# Patient Record
Sex: Female | Born: 1965 | Race: Black or African American | Hispanic: No | Smoking: Never smoker
Health system: Southern US, Community
[De-identification: ages and names within clinical notes are randomized; demographics above are authoritative.]

## PROBLEM LIST (undated history)

## (undated) DIAGNOSIS — J349 Unspecified disorder of nose and nasal sinuses: Secondary | ICD-10-CM

## (undated) DIAGNOSIS — I10 Essential (primary) hypertension: Secondary | ICD-10-CM

## (undated) DIAGNOSIS — B20 Human immunodeficiency virus [HIV] disease: Secondary | ICD-10-CM

## (undated) DIAGNOSIS — D649 Anemia, unspecified: Secondary | ICD-10-CM

## (undated) DIAGNOSIS — A6000 Herpesviral infection of urogenital system, unspecified: Secondary | ICD-10-CM

## (undated) DIAGNOSIS — H5210 Myopia, unspecified eye: Secondary | ICD-10-CM

## (undated) DIAGNOSIS — Z90710 Acquired absence of both cervix and uterus: Secondary | ICD-10-CM

## (undated) DIAGNOSIS — D219 Benign neoplasm of connective and other soft tissue, unspecified: Secondary | ICD-10-CM

## (undated) DIAGNOSIS — E669 Obesity, unspecified: Secondary | ICD-10-CM

## (undated) DIAGNOSIS — L732 Hidradenitis suppurativa: Secondary | ICD-10-CM

## (undated) HISTORY — PX: OTHER SURGICAL HISTORY: SHX169

## (undated) HISTORY — DX: Essential (primary) hypertension: I10

## (undated) HISTORY — DX: Myopia, unspecified eye: H52.10

## (undated) HISTORY — DX: Anemia, unspecified: D64.9

## (undated) HISTORY — DX: Benign neoplasm of connective and other soft tissue, unspecified: D21.9

## (undated) HISTORY — DX: Herpesviral infection of urogenital system, unspecified: A60.00

## (undated) HISTORY — DX: Acquired absence of both cervix and uterus: Z90.710

## (undated) HISTORY — DX: Hidradenitis suppurativa: L73.2

## (undated) HISTORY — DX: Obesity, unspecified: E66.9

## (undated) HISTORY — DX: Unspecified disorder of nose and nasal sinuses: J34.9

## (undated) HISTORY — PX: WISDOM TOOTH EXTRACTION: SHX21

## (undated) HISTORY — PX: ABDOMINAL HYSTERECTOMY: SHX81

## (undated) HISTORY — DX: Human immunodeficiency virus (HIV) disease: B20

---

## 2003-02-11 HISTORY — PX: OTHER SURGICAL HISTORY: SHX169

## 2003-02-11 HISTORY — PX: FLEXIBLE SIGMOIDOSCOPY: SHX1649

## 2006-01-11 ENCOUNTER — Emergency Department (HOSPITAL_COMMUNITY): Admission: EM | Admit: 2006-01-11 | Discharge: 2006-01-11 | Payer: Self-pay | Admitting: Emergency Medicine

## 2006-10-05 ENCOUNTER — Ambulatory Visit: Payer: Self-pay | Admitting: Family Medicine

## 2006-10-05 ENCOUNTER — Other Ambulatory Visit: Admission: RE | Admit: 2006-10-05 | Discharge: 2006-10-05 | Payer: Self-pay | Admitting: Family Medicine

## 2006-10-15 ENCOUNTER — Encounter: Admission: RE | Admit: 2006-10-15 | Discharge: 2006-10-15 | Payer: Self-pay | Admitting: Family Medicine

## 2007-11-18 ENCOUNTER — Ambulatory Visit: Payer: Self-pay | Admitting: Family Medicine

## 2007-11-18 ENCOUNTER — Other Ambulatory Visit: Admission: RE | Admit: 2007-11-18 | Discharge: 2007-11-18 | Payer: Self-pay | Admitting: Family Medicine

## 2007-11-23 ENCOUNTER — Encounter: Admission: RE | Admit: 2007-11-23 | Discharge: 2007-11-23 | Payer: Self-pay | Admitting: Family Medicine

## 2008-02-16 ENCOUNTER — Emergency Department (HOSPITAL_COMMUNITY): Admission: EM | Admit: 2008-02-16 | Discharge: 2008-02-16 | Payer: Self-pay | Admitting: Emergency Medicine

## 2008-02-23 ENCOUNTER — Ambulatory Visit: Payer: Self-pay | Admitting: Family Medicine

## 2008-03-06 ENCOUNTER — Ambulatory Visit: Payer: Self-pay | Admitting: Family Medicine

## 2009-01-29 ENCOUNTER — Encounter: Admission: RE | Admit: 2009-01-29 | Discharge: 2009-01-29 | Payer: Self-pay | Admitting: Family Medicine

## 2009-01-29 LAB — HM MAMMOGRAPHY: HM Mammogram: NORMAL

## 2009-03-27 ENCOUNTER — Ambulatory Visit: Payer: Self-pay | Admitting: Family Medicine

## 2009-03-27 ENCOUNTER — Other Ambulatory Visit: Admission: RE | Admit: 2009-03-27 | Discharge: 2009-03-27 | Payer: Self-pay | Admitting: Family Medicine

## 2009-11-22 ENCOUNTER — Ambulatory Visit: Payer: Self-pay | Admitting: Family Medicine

## 2010-03-05 ENCOUNTER — Ambulatory Visit
Admission: RE | Admit: 2010-03-05 | Discharge: 2010-03-05 | Payer: Self-pay | Source: Home / Self Care | Attending: Family Medicine | Admitting: Family Medicine

## 2010-05-27 LAB — URINE MICROSCOPIC-ADD ON

## 2010-05-27 LAB — URINALYSIS, ROUTINE W REFLEX MICROSCOPIC
Glucose, UA: NEGATIVE mg/dL
Protein, ur: >300 mg/dL — AB
Specific Gravity, Urine: 1.036 — ABNORMAL HIGH (ref 1.005–1.030)
Urobilinogen, UA: 1 mg/dL (ref 0.0–1.0)
pH: 6 (ref 5.0–8.0)

## 2010-05-27 LAB — URINE CULTURE: Colony Count: >=100000

## 2010-05-27 LAB — WET PREP, GENITAL
Trich, Wet Prep: NONE SEEN
Yeast Wet Prep HPF POC: NONE SEEN

## 2010-05-27 LAB — DIFFERENTIAL
Lymphocytes Relative: 36 % (ref 12–46)
Monocytes Relative: 9 % (ref 3–12)
Neutro Abs: 1.1 10*3/uL — ABNORMAL LOW (ref 1.7–7.7)
Neutrophils Relative %: 55 % (ref 43–77)

## 2010-05-27 LAB — POCT I-STAT, CHEM 8
Calcium, Ion: 1.09 mmol/L — ABNORMAL LOW (ref 1.12–1.32)
Chloride: 105 mEq/L (ref 96–112)
Creatinine, Ser: 1 mg/dL (ref 0.4–1.2)
Potassium: 3.3 mEq/L — ABNORMAL LOW (ref 3.5–5.1)
Sodium: 142 mEq/L (ref 135–145)

## 2010-05-27 LAB — CBC
Hemoglobin: 12.6 g/dL (ref 12.0–15.0)
MCV: 71.8 fL — ABNORMAL LOW (ref 78.0–100.0)
Platelets: 101 10*3/uL — ABNORMAL LOW (ref 150–400)
RDW: 16.5 % — ABNORMAL HIGH (ref 11.5–15.5)
WBC: 2 10*3/uL — ABNORMAL LOW (ref 4.0–10.5)

## 2010-08-15 ENCOUNTER — Encounter: Payer: Self-pay | Admitting: Family Medicine

## 2010-08-15 ENCOUNTER — Encounter: Payer: Self-pay | Admitting: *Deleted

## 2010-08-16 ENCOUNTER — Ambulatory Visit: Payer: Self-pay | Admitting: Family Medicine

## 2010-08-23 ENCOUNTER — Ambulatory Visit (INDEPENDENT_AMBULATORY_CARE_PROVIDER_SITE_OTHER): Payer: PRIVATE HEALTH INSURANCE | Admitting: Family Medicine

## 2010-08-23 ENCOUNTER — Encounter: Payer: Self-pay | Admitting: Family Medicine

## 2010-08-23 VITALS — BP 150/100 | HR 60 | Wt 221.0 lb

## 2010-08-23 DIAGNOSIS — B9689 Other specified bacterial agents as the cause of diseases classified elsewhere: Secondary | ICD-10-CM

## 2010-08-23 DIAGNOSIS — L739 Follicular disorder, unspecified: Secondary | ICD-10-CM

## 2010-08-23 DIAGNOSIS — L738 Other specified follicular disorders: Secondary | ICD-10-CM

## 2010-08-23 DIAGNOSIS — A499 Bacterial infection, unspecified: Secondary | ICD-10-CM

## 2010-08-23 DIAGNOSIS — N760 Acute vaginitis: Secondary | ICD-10-CM

## 2010-08-23 DIAGNOSIS — J329 Chronic sinusitis, unspecified: Secondary | ICD-10-CM

## 2010-08-23 MED ORDER — AMOXICILLIN 875 MG PO TABS: 875.0000 mg | ORAL_TABLET | Freq: Two times a day (BID) | ORAL | Status: AC

## 2010-08-23 MED ORDER — METRONIDAZOLE 500 MG PO TABS: 500.0000 mg | ORAL_TABLET | Freq: Two times a day (BID) | ORAL | Status: AC

## 2010-08-23 NOTE — Progress Notes (Signed)
  Subjective:    Patient ID: Teresa Doyle, female    DOB: 12-26-1965, 45 y.o.   MRN: [id number]  HPI She has a several month history of PND and recent onset of sinus pressure. She will occasionally cough up some purulent material as well as some difficulty with hoarse voice. No fever, chills, sore throat. She does not smoke. Has no allergies. 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 Tympanic membranes and canals are normal. Throat is clear. Tonsils are normal. Neck is supple without adenopathy or thyromegaly. Cardiac exam shows a regular sinus rhythm without murmurs or gallops. Lungs are clear to auscultation. Nasal mucosa is slightly swollen with some tenderness over sinuses       Assessment & Plan:  Folliculitis. Sinusitis. History of BV I will give her amoxicillin for the sinuses and a standing prescription for Flagyl. Commend OTC meds for the folliculitis

## 2010-08-23 NOTE — Patient Instructions (Signed)
Use heat to the area 3 or 4 times per day. Use cortisone cream if it itches a lot

## 2010-09-13 ENCOUNTER — Ambulatory Visit: Payer: PRIVATE HEALTH INSURANCE | Admitting: Family Medicine

## 2010-09-20 ENCOUNTER — Telehealth: Payer: Self-pay | Admitting: *Deleted

## 2010-09-20 ENCOUNTER — Other Ambulatory Visit: Payer: Self-pay | Admitting: Medical

## 2010-09-20 MED ORDER — FLUCONAZOLE 150 MG PO TABS: 150.0000 mg | ORAL_TABLET | Freq: Once | ORAL | Status: DC

## 2010-09-20 NOTE — Telephone Encounter (Signed)
Pt informed prescription was sent to pharmacy.  CM, LPN

## 2010-09-20 NOTE — Telephone Encounter (Signed)
i sent Diflucan x 1 tablet today to pharmacy.   She was seen almost a month ago, which seems a little far out to get yeast infection just now.  If not improving, recheck on the yeast infection.

## 2010-10-25 ENCOUNTER — Other Ambulatory Visit: Payer: Self-pay | Admitting: Family Medicine

## 2010-10-25 DIAGNOSIS — Z1231 Encounter for screening mammogram for malignant neoplasm of breast: Secondary | ICD-10-CM

## 2010-11-15 ENCOUNTER — Encounter: Payer: Self-pay | Admitting: Family Medicine

## 2010-11-15 ENCOUNTER — Ambulatory Visit
Admission: RE | Admit: 2010-11-15 | Discharge: 2010-11-15 | Disposition: A | Discharge status: Home / Self Care | Payer: PRIVATE HEALTH INSURANCE | Source: Ambulatory Visit | Attending: Family Medicine | Admitting: Family Medicine

## 2010-11-15 ENCOUNTER — Ambulatory Visit (INDEPENDENT_AMBULATORY_CARE_PROVIDER_SITE_OTHER): Payer: PRIVATE HEALTH INSURANCE | Admitting: Family Medicine

## 2010-11-15 VITALS — BP 118/76 | HR 64 | Temp 98.2°F | Ht 62.0 in | Wt 219.0 lb

## 2010-11-15 DIAGNOSIS — Z23 Encounter for immunization: Secondary | ICD-10-CM

## 2010-11-15 DIAGNOSIS — Z1231 Encounter for screening mammogram for malignant neoplasm of breast: Secondary | ICD-10-CM

## 2010-11-15 DIAGNOSIS — J209 Acute bronchitis, unspecified: Secondary | ICD-10-CM

## 2010-11-15 DIAGNOSIS — R05 Cough: Secondary | ICD-10-CM

## 2010-11-15 MED ORDER — AMOXICILLIN 875 MG PO TABS: 875.0000 mg | ORAL_TABLET | Freq: Two times a day (BID) | ORAL | Status: AC

## 2010-11-15 NOTE — Progress Notes (Signed)
  Subjective:    Patient ID: Teresa Doyle, female    DOB: 1965/11/29, 45 y.o.   MRN: [id number]  HPI She has a two-year history of intermittent hoarse voice, PND and occasional productive cough. No sneezing, itchy watery eyes or runny nose. She does not smoke. She says that she has monthly issues with this but twice per year it gets more intense. She notes that certain foods do tend to make this worse as well as talking that she has to do well at work area and lying down at night sometimes causes difficulty   Review of Systems     Objective:   Physical Exam alert and in no distress. Tympanic membranes and canals are normal. Throat is clear. Tonsils are normal. Neck is supple without adenopathy or thyromegaly. Cardiac exam shows a regular sinus rhythm without murmurs or gallops. Lungs are clear to auscultation.        Assessment & Plan:  Bronchitis I will give her Amoxil. Also recommend she take Prilosec for the next several weeks and call me and let me know how it is helping her coughing

## 2010-11-15 NOTE — Patient Instructions (Signed)
Take all the antibiotic. Use of Prilosec daily for the next several weeks and then call me

## 2010-11-18 ENCOUNTER — Telehealth: Payer: Self-pay

## 2010-11-18 NOTE — Telephone Encounter (Signed)
Pt called and asked if you could call in diflucan for her yeast inf [facility] [address].

## 2010-11-19 ENCOUNTER — Other Ambulatory Visit: Payer: Self-pay

## 2010-11-19 MED ORDER — FLUCONAZOLE 150 MG PO TABS: 150.0000 mg | ORAL_TABLET | Freq: Once | ORAL | Status: DC

## 2010-11-19 NOTE — Telephone Encounter (Signed)
Dr.lalonde I sent you a message on this pt yesterday not sure if you got it she would like diflucan called in the antibiotic has caused a yeast inf.

## 2010-11-19 NOTE — Telephone Encounter (Signed)
SENT MED IN PER JCL 

## 2010-11-19 NOTE — Telephone Encounter (Signed)
Go ahead and call in Diflucan 150 mg tablet

## 2010-11-28 ENCOUNTER — Ambulatory Visit (INDEPENDENT_AMBULATORY_CARE_PROVIDER_SITE_OTHER): Payer: PRIVATE HEALTH INSURANCE | Admitting: Family Medicine

## 2010-11-28 ENCOUNTER — Encounter: Payer: Self-pay | Admitting: Family Medicine

## 2010-11-28 VITALS — BP 124/74 | HR 64 | Wt 218.0 lb

## 2010-11-28 DIAGNOSIS — B009 Herpesviral infection, unspecified: Secondary | ICD-10-CM

## 2010-11-28 MED ORDER — VALACYCLOVIR HCL 1 G PO TABS: 1000.0000 mg | ORAL_TABLET | Freq: Two times a day (BID) | ORAL | Status: DC

## 2010-11-28 NOTE — Progress Notes (Signed)
Had a yeast infection from recent antibiotics.  Took Diflucan which helped (took 2 days after starting the ABX, and repeated after the ABX)--no longer having vaginal discharge or internal discomfort.  Complaining of painful external lesions in vaginal area--feels like abrasions, painful when urine touches it, and even for clothes to touch it.  Slightly itchy.  Presents today for evaluation of these external lesions.  Denies h/o STD, denies vaginal discharge, denies previous similar lesions.  Past Medical History  Diagnosis Date  . Obesity   . Hypertension   . Fibroids uterine    Past Surgical History  Procedure Date  . Myomectomy 2005  . Bilateral tubal ligation     History   Social History  . Marital Status: Single    Spouse Name: N/A    Number of Children: 2  . Years of Education: N/A   Occupational History  . LPN    Social History Main Topics  . Smoking status: Never Smoker   . Smokeless tobacco: Never Used  . Alcohol Use: Yes     rare  . Drug Use: No  . Sexually Active: Not on file   Other Topics Concern  . Not on file   Social History Narrative   Lives with her 2 children, dog   Family History  Problem Relation Age of Onset  . Diabetes Neg Hx    Current Outpatient Prescriptions on File Prior to Visit  Medication Sig Dispense Refill  . bisoprolol-hydrochlorothiazide (ZIAC) 10-6.25 MG per tablet Take 1 tablet by mouth daily.         No Known Allergies  ROS:  Denies fevers, myalgias, vaginal discharge, abdominal pain, headaches, cough, URI symptoms, chest pain, or other concerns  PHYSICAL EXAM: BP 124/74  Pulse 64  Wt 218 lb (98.884 kg) Pleasant female in no distress  External genitalia:  Superior/anterior to clitoris was a slightly thickened area.  L labia had an excoriation, also a small pustule/cyst that was nontender.  R labia had 2 ulcerated lesions, with some mild induration  ASSESSMENT/PLAN: 1. HSV infection  valACYclovir (VALTREX) 1000 MG tablet,  HSV(herpes simplex vrs) 1+2 ab-IgG, HSV(herpes simplex vrs) 1+2 ab-IgM   Suspect genital herpes, and history suggests this is first outbreak, therefore treating with higher dose/longer course.  Lesions too old to get an accurate culture.  Will therefore check blood test and treat presumptively.  Discussed diagnosis of herpes, how it is spread, etc and all questions answered.

## 2010-11-28 NOTE — Patient Instructions (Signed)
We are treating presumptively for possible herpes infection.  We will contact you with blood test results next week. You may use hydrocortisone cream twice daily to help with itching, if needed.  If it is herpes, and you have recurrences, the dosing of Valtrex will be different, so call for new Rx, and not just a refill.

## 2010-11-29 LAB — HSV(HERPES SIMPLEX VRS) I + II AB-IGG: HSV 2 Glycoprotein G Ab, IgG: 7.09 IV — ABNORMAL HIGH

## 2010-11-29 LAB — HSV(HERPES SIMPLEX VRS) I + II AB-IGM: Herpes Simplex Vrs I&II-IgM Ab (EIA): 1.06 INDEX

## 2010-12-09 ENCOUNTER — Other Ambulatory Visit: Payer: Self-pay | Admitting: Obstetrics and Gynecology

## 2010-12-09 ENCOUNTER — Other Ambulatory Visit (HOSPITAL_COMMUNITY)
Admission: RE | Admit: 2010-12-09 | Discharge: 2010-12-09 | Disposition: A | Discharge status: Home / Self Care | Payer: PRIVATE HEALTH INSURANCE | Source: Ambulatory Visit | Attending: Obstetrics and Gynecology | Admitting: Obstetrics and Gynecology

## 2010-12-09 DIAGNOSIS — Z124 Encounter for screening for malignant neoplasm of cervix: Secondary | ICD-10-CM | POA: Insufficient documentation

## 2010-12-09 DIAGNOSIS — R87810 Cervical high risk human papillomavirus (HPV) DNA test positive: Secondary | ICD-10-CM | POA: Insufficient documentation

## 2010-12-20 ENCOUNTER — Ambulatory Visit: Payer: PRIVATE HEALTH INSURANCE | Admitting: Medical

## 2010-12-27 ENCOUNTER — Telehealth: Payer: Self-pay | Admitting: Family Medicine

## 2010-12-28 ENCOUNTER — Other Ambulatory Visit: Payer: Self-pay | Admitting: Family Medicine

## 2010-12-28 MED ORDER — BISOPROLOL-HYDROCHLOROTHIAZIDE 10-6.25 MG PO TABS: 1.0000 | ORAL_TABLET | Freq: Every day | ORAL | Status: DC

## 2010-12-30 ENCOUNTER — Other Ambulatory Visit: Payer: Self-pay | Admitting: *Deleted

## 2010-12-30 MED ORDER — BISOPROLOL-HYDROCHLOROTHIAZIDE 10-6.25 MG PO TABS: 1.0000 | ORAL_TABLET | Freq: Every day | ORAL | Status: DC

## 2010-12-30 NOTE — Telephone Encounter (Signed)
Patient wanted 90 day supply called to [facility] [address]. Called in Ziac 10/6.25 #90 once daily with 3 refills.

## 2011-01-07 NOTE — Telephone Encounter (Signed)
DONE

## 2011-01-08 ENCOUNTER — Other Ambulatory Visit: Payer: Self-pay | Admitting: Obstetrics and Gynecology

## 2011-01-31 ENCOUNTER — Encounter: Payer: Self-pay | Admitting: Medical

## 2011-01-31 ENCOUNTER — Ambulatory Visit (INDEPENDENT_AMBULATORY_CARE_PROVIDER_SITE_OTHER): Payer: PRIVATE HEALTH INSURANCE | Admitting: Medical

## 2011-01-31 VITALS — BP 112/80 | HR 62 | Temp 98.4°F | Resp 16 | Wt 216.0 lb

## 2011-01-31 DIAGNOSIS — L732 Hidradenitis suppurativa: Secondary | ICD-10-CM | POA: Insufficient documentation

## 2011-01-31 MED ORDER — DOXYCYCLINE HYCLATE 100 MG PO TABS: 100.0000 mg | ORAL_TABLET | Freq: Two times a day (BID) | ORAL | Status: AC

## 2011-01-31 NOTE — Progress Notes (Signed)
Subjective:   HPI  Teresa Doyle is a 45 y.o. female who presents with c/o boil under right arm.  She notes similar prior a year ago that had to be lanced.  No other recurrent boils.  The last boil was MRSA+.  Denies fever, chills, and no other aggravating or relieving factors.    No other c/o.  The following portions of the patient's history were reviewed and updated as appropriate: allergies, current medications, past family history, past medical history, past social history, past surgical history and problem list.  Past Medical History  Diagnosis Date  . Obesity   . Hypertension   . Fibroids uterine   Review of Systems Constitutional: -fever, -chills, -sweats, -unexpected -weight change,-fatigue ENT: -runny nose, -ear pain, -sore throat Cardiology:  -chest pain, -palpitations, -edema Respiratory: -cough, -shortness of breath, -wheezing Gastroenterology: -abdominal pain, -nausea, -vomiting, -diarrhea, -constipation Hematology: -bleeding or bruising problems Musculoskeletal: -arthralgias, -myalgias, -joint swelling, -back pain Ophthalmology: -vision changes Urology: -dysuria, -difficulty urinating, -hematuria, -urinary frequency, -urgency Neurology: -headache, -weakness, -tingling, -numbness    Objective:   Physical Exam  Filed Vitals:   01/31/11 1128  BP: 112/80  Pulse: 62  Temp: 98.4 F (36.9 C)  Resp: 16    General appearance: alert, no distress, WD/WN Skin: right axilla with 4cm area of induration, mild erythema, but no warmth or fluctuance   Assessment and Plan :    Encounter Diagnosis  Name Primary?  . Suppurative hidradenitis Yes   Script for Doxycyline, c/t warm compresses, discussed hygiene, and once this heals, begin with new deodorant and razor to prevent contamination.  Discussed potential worsening symptoms that would prompt recheck.   Call/return if worse or not improving.  follow-up otherwise prn.

## 2011-03-04 ENCOUNTER — Other Ambulatory Visit: Payer: Self-pay | Admitting: Medical

## 2011-03-04 ENCOUNTER — Telehealth: Payer: Self-pay | Admitting: Internal Medicine

## 2011-03-04 MED ORDER — DOXYCYCLINE HYCLATE 100 MG PO TABS: 100.0000 mg | ORAL_TABLET | Freq: Two times a day (BID) | ORAL | Status: AC

## 2011-03-04 NOTE — Telephone Encounter (Signed)
rx sent

## 2011-03-07 ENCOUNTER — Ambulatory Visit (INDEPENDENT_AMBULATORY_CARE_PROVIDER_SITE_OTHER): Payer: PRIVATE HEALTH INSURANCE | Admitting: Medical

## 2011-03-07 ENCOUNTER — Other Ambulatory Visit: Payer: Self-pay | Admitting: Medical

## 2011-03-07 ENCOUNTER — Encounter: Payer: Self-pay | Admitting: Medical

## 2011-03-07 VITALS — BP 122/80 | HR 72 | Temp 98.3°F | Resp 16 | Wt 219.0 lb

## 2011-03-07 DIAGNOSIS — L02411 Cutaneous abscess of right axilla: Secondary | ICD-10-CM

## 2011-03-07 DIAGNOSIS — L732 Hidradenitis suppurativa: Secondary | ICD-10-CM

## 2011-03-07 DIAGNOSIS — IMO0002 Reserved for concepts with insufficient information to code with codable children: Secondary | ICD-10-CM

## 2011-03-07 MED ORDER — HYDROCODONE-ACETAMINOPHEN 5-500 MG PO CAPS: 1.0000 | ORAL_CAPSULE | Freq: Four times a day (QID) | ORAL | Status: AC | PRN

## 2011-03-07 NOTE — Progress Notes (Signed)
Subjective:  Teresa Doyle is a 46 y.o. female who presents for evaluation of a probable cutaneous abscess. Lesion is located in the right axilla. Onset was 3 days ago. Symptoms have gradually worsened.  Abscess has associated symptoms of pain. Patient does have previous history of cutaneous abscesses - 3 prior in axilla, hx/o MRSA. Patient does not have diabetes.   Objective:   There is an area characterized by 5cm indurated lesion with 2-3 cm central fluctuant area, surrounding erythema, tender,right axilla.  Procedure Informed consent obtained.  The area was prepped in the usual manner and the skin overlying the abscess was anesthetized with 4.5 cc of 2% plain lidocaine.  The area was sharply incised with #11 blade, and approx 4 ccs of purulent material was obtained.  Area was irrigated with high pressure saline. Packing was inserted. Wound was covered with sterile bandage.     Assessment:   Encounter Diagnoses  Name Primary?  . Suppurative hidradenitis Yes  . Abscess of axilla, right      Plan:   Discussed diagnosis, usual course of treatment, advised she continue to apply hot compresses frequently to promote drainage, continue Doxycyline that was called out a few days ago.  She will pull the packing out on Sunday in 2 days. Call report Monday.  PA student Haig Prophet assisted in procedure.

## 2011-03-07 NOTE — Patient Instructions (Signed)

## 2011-03-11 LAB — WOUND CULTURE: Gram Stain: NONE SEEN

## 2011-03-14 ENCOUNTER — Telehealth: Payer: Self-pay | Admitting: Family Medicine

## 2011-03-17 NOTE — Telephone Encounter (Signed)
DONE

## 2011-03-25 ENCOUNTER — Telehealth: Payer: Self-pay | Admitting: Internal Medicine

## 2011-03-25 NOTE — Telephone Encounter (Signed)
pt called and said that she just got done with her cycle of doxcycline and now had a yeast infection. she said she is having discharge,itching and a tad bit of odor. she wanted to know if you could call a 3 day dose [facility] [address]. She also has a history of BV and shes not sure if that is what she may have too.

## 2011-03-26 ENCOUNTER — Other Ambulatory Visit: Payer: Self-pay | Admitting: Medical

## 2011-03-26 MED ORDER — VALACYCLOVIR HCL 1 G PO TABS: 1000.0000 mg | ORAL_TABLET | Freq: Two times a day (BID) | ORAL | Status: DC

## 2011-03-26 MED ORDER — FLUCONAZOLE 100 MG PO TABS: ORAL_TABLET | ORAL | Status: DC

## 2011-03-26 NOTE — Telephone Encounter (Signed)
i sent diflucan to pharmacy.  I would have to check swabs to verify BV.  She can come in for eval if desired, otherwise, can begin round of diflucan

## 2011-03-26 NOTE — Telephone Encounter (Signed)
I TOLD THE PATIENT THAT Teresa Doyle FOUND THE LABS AND THAT HE WOULD SEND THE RX TO THE PHARMACY SHE WILL NEED TO CHECK WITH THE PHARMACY LATER. CLS

## 2011-03-26 NOTE — Telephone Encounter (Signed)
PATIENT WANTS TO KNOW CAN SHE GET A REFILL ON VALACYCLOVIR. SHE SAID SHE WAS DX WITH BEING EXPOSED TO HERPES YEARS AGO AND WAS TOLD TO TREAT WHEN HAVING SX. SHE SAID SHE HAS NOT HAD A OUTBREAK BUT SHE HAS A FEW SPOTS NOW. CLS   PATIENT WAS NOTIFIED THAT DIFLUCIAN WAS SENT TO THE PHARMACY. CLS

## 2011-03-26 NOTE — Telephone Encounter (Signed)
So does this mean she herself has been diagnosed prior or not?  This sounds like she should come in for eval today/exam for this?

## 2011-05-07 ENCOUNTER — Ambulatory Visit (INDEPENDENT_AMBULATORY_CARE_PROVIDER_SITE_OTHER): Payer: PRIVATE HEALTH INSURANCE | Admitting: Medical

## 2011-05-07 ENCOUNTER — Encounter: Payer: Self-pay | Admitting: Medical

## 2011-05-07 VITALS — BP 120/80 | HR 62 | Temp 98.7°F | Resp 16 | Wt 210.0 lb

## 2011-05-07 DIAGNOSIS — L732 Hidradenitis suppurativa: Secondary | ICD-10-CM

## 2011-05-07 DIAGNOSIS — Q839 Congenital malformation of breast, unspecified: Secondary | ICD-10-CM

## 2011-05-07 DIAGNOSIS — R21 Rash and other nonspecific skin eruption: Secondary | ICD-10-CM

## 2011-05-07 DIAGNOSIS — Q838 Other congenital malformations of breast: Secondary | ICD-10-CM

## 2011-05-07 MED ORDER — MUPIROCIN 2 % EX OINT: TOPICAL_OINTMENT | Freq: Three times a day (TID) | CUTANEOUS | Status: AC

## 2011-05-07 MED ORDER — DOXYCYCLINE HYCLATE 100 MG PO TABS: 100.0000 mg | ORAL_TABLET | Freq: Two times a day (BID) | ORAL | Status: AC

## 2011-05-07 NOTE — Progress Notes (Signed)
Subjective:   HPI  Teresa Doyle is a 46 y.o. female who presents with five-day history of left axillary boil like she's had before. This is her third such lesion, but usually they're on the right underarm.  She has a positive history of suppurative hidradenitis, MRSA positive.  In general she has been using Hibiclens wash daily, using fresh razors for underarm shaving.  She denies fever, chills, nausea, vomiting.  She has a second concern of rash on her breast. She notes one-year history of rash around both areolas, has discussed this with Dr. Susann Givens here prior, but the rash never went away.  She wants to make sure something worrisome.  He has used some hydrocortisone cream in the past and it helped a little with itching, but nothing has made the rash go away completely. No other aggravating or relieving factors.  She sees gynecology, up-to-date on mammogram which has been normal  No other c/o.  The following portions of the patient's history were reviewed and updated as appropriate: allergies, current medications, past family history, past medical history, past social history, past surgical history and problem list.  Past Medical History  Diagnosis Date  . Obesity   . Hypertension   . Fibroids uterine  . Suppurative hidradenitis     w/ prior multiple abscess, MRSA +    No Known Allergies   Review of Systems ROS reviewed and was negative other than noted in HPI or above.    Objective:   Physical Exam  General appearance: alert, no distress, WD/WN Left axilla with tender 2 cm indurated and somewhat warm lesion, but no fluctuance or drainage. Bilat areoli with slightly rough skin circumferential around the areola, and there are a few slightly raised small 1 mm papules within the rough area of the areoli circumferentially of both breasts, and the right breat has an area of brown flat skin asymmetrically at 10 o'clock location of right areola   Assessment and Plan :     Encounter  Diagnoses  Name Primary?  . Suppurative hidradenitis Yes  . Rash, skin   . Breast anomaly    Suppurative hidradenitis with cellulitis-prescription for doxycycline, warm compresses, continue Hibiclens routinely, good hygiene, and call or return if worse or not improving  Rash of bilateral areoli - refer to dermatology for further eval

## 2011-06-04 ENCOUNTER — Ambulatory Visit: Payer: PRIVATE HEALTH INSURANCE | Admitting: Medical

## 2011-06-05 ENCOUNTER — Ambulatory Visit (INDEPENDENT_AMBULATORY_CARE_PROVIDER_SITE_OTHER): Payer: PRIVATE HEALTH INSURANCE | Admitting: Family Medicine

## 2011-06-05 ENCOUNTER — Encounter: Payer: Self-pay | Admitting: Family Medicine

## 2011-06-05 VITALS — BP 130/80 | HR 76 | Wt 213.0 lb

## 2011-06-05 DIAGNOSIS — IMO0001 Reserved for inherently not codable concepts without codable children: Secondary | ICD-10-CM

## 2011-06-05 DIAGNOSIS — S53449A Ulnar collateral ligament sprain of unspecified elbow, initial encounter: Secondary | ICD-10-CM

## 2011-06-05 NOTE — Patient Instructions (Signed)
Leave alone for now but if you are still having difficulty in 2 weeks, call for referral to hand specialist
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
## 2011-06-10 ENCOUNTER — Ambulatory Visit (INDEPENDENT_AMBULATORY_CARE_PROVIDER_SITE_OTHER): Payer: PRIVATE HEALTH INSURANCE | Admitting: Medical

## 2011-06-10 ENCOUNTER — Encounter: Payer: Self-pay | Admitting: Medical

## 2011-06-10 VITALS — BP 120/80 | HR 60 | Temp 98.2°F | Resp 16 | Ht 62.0 in | Wt 214.0 lb

## 2011-06-10 DIAGNOSIS — Z0000 Encounter for general adult medical examination without abnormal findings: Secondary | ICD-10-CM

## 2011-06-10 DIAGNOSIS — D649 Anemia, unspecified: Secondary | ICD-10-CM

## 2011-06-10 DIAGNOSIS — I10 Essential (primary) hypertension: Secondary | ICD-10-CM

## 2011-06-10 DIAGNOSIS — E049 Nontoxic goiter, unspecified: Secondary | ICD-10-CM

## 2011-06-10 DIAGNOSIS — R32 Unspecified urinary incontinence: Secondary | ICD-10-CM | POA: Insufficient documentation

## 2011-06-10 DIAGNOSIS — E669 Obesity, unspecified: Secondary | ICD-10-CM

## 2011-06-10 NOTE — Patient Instructions (Signed)

## 2011-06-10 NOTE — Progress Notes (Signed)
Subjective:   HPI  Teresa Doyle is a 46 y.o. female who presents for a complete physical.  She is fasting today.   Sees gynecology, [facility] on [address], last mammogram 16/10, last pap 10/12, had abnormal pap and is going back soon for repeat pap.  Had colposcopy.  Last opthalmology 10/12.  Last dental visit - goes tomorrow.  Last tdap at our facility?    Hx/o iron deficiency anemia.  Last iron orally was 15 years ago.   Hx/o goiter, ultrasound years ago, no other workup.   Reviewed their medical, surgical, family, social, medication, and allergy history and updated chart as appropriate.   Past Medical History  Diagnosis Date  . Obesity   . Hypertension   . Fibroids uterine  . Suppurative hidradenitis     w/ prior multiple abscess, MRSA +  . Sinus problem     twice yearly   . Anemia     iron deficient, since teenage years  . Nearsightedness     wears glasses  . Genital HSV   . Urinary incontinence   . Normal labor and delivery     5/92 and 8/95    Past Surgical History  Procedure Date  . Myomectomy 2005  . Bilateral tubal ligation   . Wisdom tooth extraction   . Flexible sigmoidoscopy 2005    due to abdominal pain; normal     Family History  Problem Relation Age of Onset  . Diabetes Neg Hx   . Stroke Neg Hx   . Cancer Neg Hx   . Heart disease Neg Hx   . Hypertension Neg Hx   . Hyperlipidemia Mother     History   Social History  . Marital Status: Single    Spouse Name: N/A    Number of Children: 2  . 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 Smoking status: Never Smoker   . Smokeless tobacco: Never Used  . Alcohol Use: No     rare  . Drug Use: No  . Sexually Active: Not on file   Other Topics Concern  . Not on file   Social History Narrative   Lives with her 2 children (1 daughter, 1 son), dog; single, exercise - walking, sometimes treadmill with  fitness center at work    Current Outpatient Prescriptions on File Prior to Visit  Medication Sig Dispense Refill  . bisoprolol-hydrochlorothiazide (ZIAC) 10-6.25 MG per tablet Take 1 tablet by mouth daily.  90 tablet  3  . valACYclovir (VALTREX) 1000 MG tablet Take 1 tablet (1,000 mg total) by mouth 2 (two) times daily.  20 tablet  1    No Known Allergies  Review of Systems Constitutional: -fever, -chills, -sweats, -unexpected weight change, -anorexia, -fatigue Allergy: -sneezing, -itching, -congestion Dermatology: denies changing moles, rash, lumps, new worrisome lesions ENT: -runny nose, -ear pain, -sore throat, -hoarseness, -sinus pain, -teeth pain, -tinnitus, -hearing loss, -epistaxis Cardiology:  -chest pain, -palpitations, -edema, -orthopnea, -paroxysmal nocturnal dyspnea Respiratory: -cough, -shortness of breath, -dyspnea on exertion, -wheezing, -hemoptysis Gastroenterology: -abdominal pain, -nausea, -vomiting, -diarrhea, -constipation, -blood in stool, -changes in bowel movement, -dysphagia Hematology: -bleeding or bruising problems Musculoskeletal: -arthralgias, -myalgias, -joint swelling, -back pain, -neck pain, -cramping, -gait changes Ophthalmology: -vision changes, -eye redness, -itching, -discharge Urology: -dysuria, -difficulty urinating, -hematuria, -urinary frequency, -urgency, incontinence Neurology: -headache, -weakness, -tingling, -numbness, -speech abnormality, -memory loss, -falls, -  dizziness Psychology:  -depressed mood, -agitation, -sleep problems      Objective:   Physical Exam  Filed Vitals:   06/10/11 0930  BP: 120/80  Pulse: 60  Temp: 98.2 F (36.8 C)  Resp: 16    General appearance: alert, no distress, WD/WN, obese female  Skin: few scattered benign appearing macules HEENT: normocephalic, conjunctiva/corneas normal, sclerae anicteric, PERRLA, EOMi, nares patent, no discharge or erythema, pharynx normal Oral cavity: MMM, tongue normal, teeth in  good repair Neck: supple, no lymphadenopathy, no thyromegaly, no masses, normal ROM, no bruits Chest: non tender, normal shape and expansion Heart: RRR, normal S1, S2, no murmurs Lungs: CTA bilaterally, no wheezes, rhonchi, or rales Abdomen: +bs, soft, non tender, non distended, no masses, no hepatomegaly, no splenomegaly, no bruits Back: non tender, normal ROM, no scoliosis Musculoskeletal: upper extremities non tender, no obvious deformity, normal ROM throughout, lower extremities non tender, no obvious deformity, normal ROM throughout Extremities: no edema, no cyanosis, no clubbing Pulses: 2+ symmetric, upper and lower extremities, normal cap refill Neurological: alert, oriented x 3, CN2-12 intact, strength normal upper extremities and lower extremities, sensation normal throughout, DTRs 2+ throughout, no cerebellar signs, gait normal Psychiatric: normal affect, behavior normal, pleasant  Breast/gyn/rectal - deferred to gyn   Assessment and Plan :    Encounter Diagnoses  Name Primary?  . Routine general medical examination at a health care facility Yes  . Anemia   . Obesity   . Essential hypertension, benign   . Goiter   . Urinary incontinence     Physical exam - discussed healthy lifestyle, diet, exercise, preventative care, vaccinations, and addressed their concerns.  Handout given.  Labs today, may need further eval on anemia and goiter pending labs.   Sent home with stool cards for hemoccult testing.   Obesity - discussed need for lifestyle, exercise and diet changes.   She will f/u next month with gynecology and will discuss her incontinence and possible therapy options.  Follow-up pending labs.

## 2011-06-11 LAB — RETICULOCYTES
ABS Retic: 55.8 10*3/uL (ref 19.0–186.0)
RBC.: 4.65 MIL/uL (ref 3.87–5.11)
Retic Ct Pct: 1.2 % (ref 0.4–2.3)

## 2011-06-11 LAB — LIPID PANEL
Cholesterol: 158 mg/dL (ref 0–200)
HDL: 67 mg/dL (ref >39)
LDL Cholesterol: 74 mg/dL (ref 0–99)
Triglycerides: 85 mg/dL (ref <150)

## 2011-06-11 LAB — COMPREHENSIVE METABOLIC PANEL
AST: 22 U/L (ref 0–37)
Albumin: 3.5 g/dL (ref 3.5–5.2)
BUN: 12 mg/dL (ref 6–23)
Calcium: 8.7 mg/dL (ref 8.4–10.5)
Creat: 0.54 mg/dL (ref 0.50–1.10)
Total Bilirubin: 0.3 mg/dL (ref 0.3–1.2)
Total Protein: 6.7 g/dL (ref 6.0–8.3)

## 2011-06-11 LAB — IBC PANEL
%SAT: 14 % — ABNORMAL LOW (ref 20–55)
TIBC: 317 ug/dL (ref 250–470)

## 2011-06-11 LAB — CBC WITH DIFFERENTIAL/PLATELET
Eosinophils Absolute: 0.1 10*3/uL (ref 0.0–0.7)
Lymphs Abs: 1 10*3/uL (ref 0.7–4.0)
MCH: 21.9 pg — ABNORMAL LOW (ref 26.0–34.0)
Neutro Abs: 2.1 10*3/uL (ref 1.7–7.7)
Neutrophils Relative %: 57 % (ref 43–77)
Platelets: 171 10*3/uL (ref 150–400)
RBC: 4.65 MIL/uL (ref 3.87–5.11)
WBC: 3.6 10*3/uL — ABNORMAL LOW (ref 4.0–10.5)

## 2011-06-11 LAB — T4, FREE: Free T4: 1.18 ng/dL (ref 0.80–1.80)

## 2011-06-11 LAB — TSH: TSH: 0.476 u[IU]/mL (ref 0.350–4.500)

## 2011-06-13 ENCOUNTER — Other Ambulatory Visit: Payer: Self-pay | Admitting: Medical

## 2011-06-13 MED ORDER — FERROUS FUM-IRON POLYSACCH 162-115.2 MG PO CAPS: 1.0000 | ORAL_CAPSULE | Freq: Every day | ORAL | Status: DC

## 2011-06-13 NOTE — Progress Notes (Signed)
Addended by: Janeice Robinson on: 06/13/2011 10:39 AM   Modules accepted: Orders

## 2011-06-18 ENCOUNTER — Other Ambulatory Visit: Payer: PRIVATE HEALTH INSURANCE

## 2011-06-25 ENCOUNTER — Other Ambulatory Visit (INDEPENDENT_AMBULATORY_CARE_PROVIDER_SITE_OTHER): Payer: PRIVATE HEALTH INSURANCE

## 2011-06-25 DIAGNOSIS — Z1211 Encounter for screening for malignant neoplasm of colon: Secondary | ICD-10-CM

## 2011-06-25 LAB — HEMOCCULT GUIAC POC 1CARD (OFFICE)
Card #3 Fecal Occult Blood, POC: NEGATIVE
Fecal Occult Blood, POC: NEGATIVE

## 2011-10-22 ENCOUNTER — Other Ambulatory Visit: Payer: Self-pay | Admitting: Family Medicine

## 2011-10-22 DIAGNOSIS — Z1231 Encounter for screening mammogram for malignant neoplasm of breast: Secondary | ICD-10-CM

## 2011-11-11 DIAGNOSIS — B20 Human immunodeficiency virus [HIV] disease: Secondary | ICD-10-CM

## 2011-11-11 HISTORY — DX: Human immunodeficiency virus (HIV) disease: B20

## 2011-11-14 ENCOUNTER — Ambulatory Visit (INDEPENDENT_AMBULATORY_CARE_PROVIDER_SITE_OTHER): Payer: Self-pay | Admitting: Family Medicine

## 2011-11-14 VITALS — BP 120/70 | HR 77 | Temp 99.2°F

## 2011-11-14 DIAGNOSIS — R05 Cough: Secondary | ICD-10-CM

## 2011-11-14 DIAGNOSIS — R0989 Other specified symptoms and signs involving the circulatory and respiratory systems: Secondary | ICD-10-CM

## 2011-11-14 LAB — CBC WITH DIFFERENTIAL/PLATELET
Eosinophils Relative: 1 % (ref 0–5)
HCT: 30.6 % — ABNORMAL LOW (ref 36.0–46.0)
Hemoglobin: 10.1 g/dL — ABNORMAL LOW (ref 12.0–15.0)
Lymphocytes Relative: 15 % (ref 12–46)
Lymphs Abs: 0.9 10*3/uL (ref 0.7–4.0)
MCV: 66.4 fL — ABNORMAL LOW (ref 78.0–100.0)
Monocytes Absolute: 0.9 10*3/uL (ref 0.1–1.0)
Monocytes Relative: 15 % — ABNORMAL HIGH (ref 3–12)
RBC: 4.61 MIL/uL (ref 3.87–5.11)
WBC: 5.9 10*3/uL (ref 4.0–10.5)

## 2011-11-14 LAB — BASIC METABOLIC PANEL
BUN: 10 mg/dL (ref 6–23)
CO2: 26 mEq/L (ref 19–32)
Chloride: 100 mEq/L (ref 96–112)
Creat: 0.78 mg/dL (ref 0.50–1.10)
Potassium: 3.5 mEq/L (ref 3.5–5.3)

## 2011-11-14 MED ORDER — AMOXICILLIN 875 MG PO TABS: 875.0000 mg | ORAL_TABLET | Freq: Two times a day (BID) | ORAL | Status: DC

## 2011-11-14 MED ORDER — ALBUTEROL SULFATE HFA 108 (90 BASE) MCG/ACT IN AERS: 2.0000 | INHALATION_SPRAY | Freq: Four times a day (QID) | RESPIRATORY_TRACT | Status: DC | PRN

## 2011-11-14 NOTE — Progress Notes (Signed)
Pt has appt for monday

## 2011-11-14 NOTE — Progress Notes (Signed)
  Subjective:    Patient ID: Teresa Doyle, female    DOB: 07-Sep-1965, 46 y.o.   MRN: [id number]  HPI Approximately 10 days ago she noted difficulty with shortness of breath with activity as well as cough, fever and sweating as well as fatigue. Any activity will make the cough. She could not take a deep breath without coughing. She does complain of dyspnea on exertion but no chest pain, PND. She did take 3 doses of Tamiflu and she had left over so no results. No family history of heart disease. She's never had any other issues with her heart.   Review of Systems     Objective:   Physical Exam alert and in no distress. Tympanic membranes and canals are normal. Throat is clear. Tonsils are normal. Neck is supple without adenopathy or thyromegaly. Cardiac exam shows a regular sinus rhythm without murmurs or gallops. Lungs are clear to auscultation after nebulizer treatment. Prior to this she did have some respiratory wheezing. X-ray shows questionable vascular congestion. EKG shows no acute changes.         Assessment & Plan:   1. DOE (dyspnea on exertion)  PR ELECTROCARDIOGRAM, COMPLETE, Brain natriuretic peptide, CBC with Differential, Basic Metabolic Panel  2. Cough  Brain natriuretic peptide, CBC with Differential, Basic Metabolic Panel   her symptoms are not clear-cut. Some of them some cardiac but other some more bronchitis.

## 2011-11-17 ENCOUNTER — Ambulatory Visit: Payer: PRIVATE HEALTH INSURANCE

## 2011-11-17 ENCOUNTER — Encounter: Payer: Self-pay | Admitting: Family Medicine

## 2011-11-17 ENCOUNTER — Ambulatory Visit (INDEPENDENT_AMBULATORY_CARE_PROVIDER_SITE_OTHER): Payer: Self-pay | Admitting: Family Medicine

## 2011-11-17 VITALS — Temp 101.8°F | Wt 210.0 lb

## 2011-11-17 DIAGNOSIS — J0410 Acute tracheitis without obstruction: Secondary | ICD-10-CM

## 2011-11-17 DIAGNOSIS — J209 Acute bronchitis, unspecified: Secondary | ICD-10-CM

## 2011-11-17 MED ORDER — AMOXICILLIN-POT CLAVULANATE 875-125 MG PO TABS: 1.0000 | ORAL_TABLET | Freq: Two times a day (BID) | ORAL | Status: DC

## 2011-11-17 MED ORDER — METHYLPREDNISOLONE SODIUM SUCC 125 MG IJ SOLR: 125.0000 mg | Freq: Once | INTRAMUSCULAR | Status: DC

## 2011-11-17 NOTE — Patient Instructions (Signed)
If no improvement, I will refer you to the lung specialist.

## 2011-11-17 NOTE — Progress Notes (Signed)
  Subjective:    Patient ID: Teresa Doyle, female    DOB: 1965/11/29, 46 y.o.   MRN: [id number]  HPI She is here for a recheck. She states that she really has made very little improvement. She still has a great deal of difficulty inhaling which makes her cough. She was unable to get her inhaler due to cost however she did fill the antibiotic.  Review of Systems     Objective:   Physical Exam Alert and in no distress. Pulse ox was low at around 90. Lungs difficult to hear due to poor inspiratory effort.       Assessment & Plan:   1. Acute bronchitis  amoxicillin-clavulanate (AUGMENTIN) 875-125 MG per tablet, methylPREDNISolone sodium succinate (SOLU-MEDROL) 125 mg/2 mL injection  2. Acute tracheitis  methylPREDNISolone sodium succinate (SOLU-MEDROL) 125 mg/2 mL injection   she was given a sample of Proventil and recommend using 2 puffs 4 times a day. I did show her how to use this. If she does not improve with the switch to Augmentin & Medrol, I will refer to pulmonary.

## 2011-11-19 ENCOUNTER — Encounter (HOSPITAL_COMMUNITY): Payer: Self-pay | Admitting: Emergency Medicine

## 2011-11-19 ENCOUNTER — Inpatient Hospital Stay (HOSPITAL_COMMUNITY)
Admission: EM | Admit: 2011-11-19 | Discharge: 2011-11-25 | DRG: 974 | Disposition: A | Discharge status: Home / Self Care | Payer: Medicaid Other | Attending: Internal Medicine | Admitting: Internal Medicine

## 2011-11-19 ENCOUNTER — Emergency Department (HOSPITAL_COMMUNITY): Payer: Medicaid Other

## 2011-11-19 DIAGNOSIS — D72829 Elevated white blood cell count, unspecified: Secondary | ICD-10-CM

## 2011-11-19 DIAGNOSIS — F4321 Adjustment disorder with depressed mood: Secondary | ICD-10-CM | POA: Diagnosis not present

## 2011-11-19 DIAGNOSIS — J9600 Acute respiratory failure, unspecified whether with hypoxia or hypercapnia: Secondary | ICD-10-CM

## 2011-11-19 DIAGNOSIS — B59 Pneumocystosis: Principal | ICD-10-CM

## 2011-11-19 DIAGNOSIS — E860 Dehydration: Secondary | ICD-10-CM

## 2011-11-19 DIAGNOSIS — E049 Nontoxic goiter, unspecified: Secondary | ICD-10-CM

## 2011-11-19 DIAGNOSIS — R0902 Hypoxemia: Secondary | ICD-10-CM

## 2011-11-19 DIAGNOSIS — D649 Anemia, unspecified: Secondary | ICD-10-CM

## 2011-11-19 DIAGNOSIS — L732 Hidradenitis suppurativa: Secondary | ICD-10-CM

## 2011-11-19 DIAGNOSIS — Z23 Encounter for immunization: Secondary | ICD-10-CM

## 2011-11-19 DIAGNOSIS — F411 Generalized anxiety disorder: Secondary | ICD-10-CM | POA: Diagnosis present

## 2011-11-19 DIAGNOSIS — A419 Sepsis, unspecified organism: Secondary | ICD-10-CM

## 2011-11-19 DIAGNOSIS — J189 Pneumonia, unspecified organism: Secondary | ICD-10-CM

## 2011-11-19 DIAGNOSIS — E871 Hypo-osmolality and hyponatremia: Secondary | ICD-10-CM

## 2011-11-19 DIAGNOSIS — E669 Obesity, unspecified: Secondary | ICD-10-CM

## 2011-11-19 DIAGNOSIS — Z79899 Other long term (current) drug therapy: Secondary | ICD-10-CM

## 2011-11-19 DIAGNOSIS — L02411 Cutaneous abscess of right axilla: Secondary | ICD-10-CM

## 2011-11-19 DIAGNOSIS — F419 Anxiety disorder, unspecified: Secondary | ICD-10-CM

## 2011-11-19 DIAGNOSIS — I10 Essential (primary) hypertension: Secondary | ICD-10-CM

## 2011-11-19 DIAGNOSIS — J9601 Acute respiratory failure with hypoxia: Secondary | ICD-10-CM

## 2011-11-19 DIAGNOSIS — A6000 Herpesviral infection of urogenital system, unspecified: Secondary | ICD-10-CM | POA: Diagnosis present

## 2011-11-19 DIAGNOSIS — B20 Human immunodeficiency virus [HIV] disease: Secondary | ICD-10-CM

## 2011-11-19 LAB — MRSA PCR SCREENING: MRSA by PCR: NEGATIVE

## 2011-11-19 LAB — COMPREHENSIVE METABOLIC PANEL
AST: 35 U/L (ref 0–37)
Albumin: 3 g/dL — ABNORMAL LOW (ref 3.5–5.2)
Alkaline Phosphatase: 70 U/L (ref 39–117)
BUN: 10 mg/dL (ref 6–23)
CO2: 24 mEq/L (ref 19–32)
Chloride: 96 mEq/L (ref 96–112)
Creatinine, Ser: 0.73 mg/dL (ref 0.50–1.10)
GFR calc non Af Amer: >90 mL/min (ref >90)
Potassium: 3.8 mEq/L (ref 3.5–5.1)
Total Bilirubin: 0.4 mg/dL (ref 0.3–1.2)

## 2011-11-19 LAB — URINALYSIS, ROUTINE W REFLEX MICROSCOPIC
Bilirubin Urine: NEGATIVE
Glucose, UA: NEGATIVE mg/dL
Ketones, ur: NEGATIVE mg/dL
Leukocytes, UA: NEGATIVE
Protein, ur: NEGATIVE mg/dL
pH: 7 (ref 5.0–8.0)

## 2011-11-19 LAB — CBC WITH DIFFERENTIAL/PLATELET
Basophils Relative: 0 % (ref 0–1)
Eosinophils Relative: 0 % (ref 0–5)
Hemoglobin: 11.2 g/dL — ABNORMAL LOW (ref 12.0–15.0)
Lymphs Abs: 1.1 10*3/uL (ref 0.7–4.0)
MCH: 22.5 pg — ABNORMAL LOW (ref 26.0–34.0)
MCV: 67.5 fL — ABNORMAL LOW (ref 78.0–100.0)
Monocytes Absolute: 0.9 10*3/uL (ref 0.1–1.0)
Monocytes Relative: 7 % (ref 3–12)
RBC: 4.98 MIL/uL (ref 3.87–5.11)
WBC: 13.3 10*3/uL — ABNORMAL HIGH (ref 4.0–10.5)

## 2011-11-19 MED ORDER — SODIUM CHLORIDE 0.9 % IJ SOLN
3.0000 mL | Freq: Two times a day (BID) | INTRAMUSCULAR | Status: DC
  Administered 2011-11-19 – 2011-11-24 (×6): 3 mL via INTRAVENOUS

## 2011-11-19 MED ORDER — VANCOMYCIN HCL IN DEXTROSE 1-5 GM/200ML-% IV SOLN
1000.0000 mg | Freq: Three times a day (TID) | INTRAVENOUS | Status: DC
  Administered 2011-11-20 (×2): 1000 mg via INTRAVENOUS
  Filled 2011-11-19 (×4): qty 200

## 2011-11-19 MED ORDER — VANCOMYCIN HCL IN DEXTROSE 1-5 GM/200ML-% IV SOLN
1000.0000 mg | Freq: Once | INTRAVENOUS | Status: AC
  Administered 2011-11-19: 1000 mg via INTRAVENOUS
  Filled 2011-11-19: qty 200

## 2011-11-19 MED ORDER — LEVALBUTEROL HCL 0.63 MG/3ML IN NEBU
0.6300 mg | INHALATION_SOLUTION | Freq: Four times a day (QID) | RESPIRATORY_TRACT | Status: DC | PRN
  Filled 2011-11-19: qty 3

## 2011-11-19 MED ORDER — DEXTROSE 5 % IV SOLN
1.0000 g | INTRAVENOUS | Status: DC
  Administered 2011-11-20 – 2011-11-21 (×2): 1 g via INTRAVENOUS
  Filled 2011-11-19 (×2): qty 10

## 2011-11-19 MED ORDER — ENOXAPARIN SODIUM 40 MG/0.4ML ~~LOC~~ SOLN
40.0000 mg | SUBCUTANEOUS | Status: DC
  Administered 2011-11-19 – 2011-11-24 (×6): 40 mg via SUBCUTANEOUS
  Filled 2011-11-19 (×7): qty 0.4

## 2011-11-19 MED ORDER — ONDANSETRON HCL 4 MG/2ML IJ SOLN: 4.0000 mg | Freq: Three times a day (TID) | INTRAMUSCULAR | Status: DC | PRN

## 2011-11-19 MED ORDER — ONDANSETRON HCL 4 MG PO TABS
4.0000 mg | ORAL_TABLET | Freq: Four times a day (QID) | ORAL | Status: DC | PRN
  Administered 2011-11-25: 4 mg via ORAL
  Filled 2011-11-19: qty 1

## 2011-11-19 MED ORDER — DEXTROSE 5 % IV SOLN
500.0000 mg | INTRAVENOUS | Status: DC
  Administered 2011-11-19 – 2011-11-21 (×3): 500 mg via INTRAVENOUS
  Filled 2011-11-19 (×4): qty 500

## 2011-11-19 MED ORDER — ACETAMINOPHEN 325 MG PO TABS
650.0000 mg | ORAL_TABLET | Freq: Four times a day (QID) | ORAL | Status: DC | PRN
  Administered 2011-11-19 – 2011-11-25 (×8): 650 mg via ORAL
  Filled 2011-11-19 (×9): qty 2

## 2011-11-19 MED ORDER — DEXTROSE 5 % IV SOLN
1.0000 g | Freq: Once | INTRAVENOUS | Status: AC
  Administered 2011-11-19: 1 g via INTRAVENOUS
  Filled 2011-11-19: qty 10

## 2011-11-19 MED ORDER — ALBUTEROL SULFATE (5 MG/ML) 0.5% IN NEBU
5.0000 mg | INHALATION_SOLUTION | Freq: Once | RESPIRATORY_TRACT | Status: AC
  Administered 2011-11-19: 5 mg via RESPIRATORY_TRACT
  Filled 2011-11-19: qty 1

## 2011-11-19 MED ORDER — SODIUM CHLORIDE 0.9 % IV SOLN
Freq: Once | INTRAVENOUS | Status: AC
  Administered 2011-11-19: 1000 mL/h via INTRAVENOUS

## 2011-11-19 MED ORDER — POLYETHYLENE GLYCOL 3350 17 G PO PACK
17.0000 g | PACK | Freq: Every day | ORAL | Status: DC | PRN
  Filled 2011-11-19: qty 1

## 2011-11-19 MED ORDER — SODIUM CHLORIDE 0.9 % IV SOLN
INTRAVENOUS | Status: DC
  Administered 2011-11-19: 50 mL/h via INTRAVENOUS
  Administered 2011-11-20 – 2011-11-21 (×2): via INTRAVENOUS

## 2011-11-19 MED ORDER — SODIUM CHLORIDE 0.9 % IV SOLN: INTRAVENOUS | Status: DC

## 2011-11-19 MED ORDER — SODIUM CHLORIDE 0.9 % IV BOLUS (SEPSIS)
1000.0000 mL | Freq: Once | INTRAVENOUS | Status: AC
  Administered 2011-11-19: 1000 mL via INTRAVENOUS

## 2011-11-19 MED ORDER — ONDANSETRON HCL 4 MG/2ML IJ SOLN
4.0000 mg | Freq: Four times a day (QID) | INTRAMUSCULAR | Status: DC | PRN
  Filled 2011-11-19: qty 2

## 2011-11-19 MED ORDER — ACETAMINOPHEN 650 MG RE SUPP: 650.0000 mg | Freq: Four times a day (QID) | RECTAL | Status: DC | PRN

## 2011-11-19 MED ORDER — SODIUM CHLORIDE 0.9 % IV SOLN: Freq: Once | INTRAVENOUS | Status: DC

## 2011-11-19 MED ORDER — ACETAMINOPHEN 325 MG PO TABS
650.0000 mg | ORAL_TABLET | Freq: Once | ORAL | Status: AC
  Administered 2011-11-19: 650 mg via ORAL
  Filled 2011-11-19: qty 2

## 2011-11-19 NOTE — ED Notes (Signed)
Onset SOB sept 25th and fatigue helping family member move.  Seen primary Doctor for fever and SOB was given antibiotics states still has SOB and pain currently headache and upper back 5/10 achy

## 2011-11-19 NOTE — Consult Note (Signed)
ANTIBIOTIC CONSULT NOTE - INITIAL  Pharmacy Consult for Vancomycin Indication: pneumonia  No Known Allergies  Patient Measurements: Height: 5' 1.81" (157 cm) Weight: 210 lb 1.6 oz (95.3 kg) IBW/kg (Calculated) : 49.67   Vital Signs: Temp: 100.3 F (37.9 C) (10/09 1412) Temp src: Oral (10/09 1412) BP: 98/58 mmHg (10/09 1600) Pulse Rate: 94  (10/09 1600) Intake/Output from previous day:   Intake/Output from this shift:    Labs:  Basename 11/19/11 1248  WBC 13.3*  HGB 11.2*  PLT 475*  LABCREA --  CREATININE 0.73   Estimated Creatinine Clearance: 94.2 ml/min (by C-G formula based on Cr of 0.73).  Microbiology: No results found for this or any previous visit (from the past 720 hour(s)).  Medical History: Past Medical History  Diagnosis Date  . Obesity   . Hypertension   . Fibroids uterine  . Suppurative hidradenitis     w/ prior multiple abscess, MRSA +  . Sinus problem     twice yearly   . Anemia     iron deficient, since teenage years  . Nearsightedness     wears glasses  . Genital HSV   . Urinary incontinence   . Normal labor and delivery     5/92 and 8/95   Assessment: 46yof who was seen by her PCP on 10/4 and given amoxicillin for bronchitis. Returned to her PCP 10/7 as she was not getting better and switched to augmentin. Now presents to ED with continued SOB and cough, leukocytosis, and fever. CXR suspicious for viral vs atypical pneumonia. She will begin vancomycin. Renal function wnl.  Goal of Therapy:  Vancomycin trough level 15-20 mcg/ml  Plan:  1) Vancomycin 1g IV q8 2) Follow renal function, cultures, levels as indicated  Fredrik Rigger 11/19/2011,4:55 PM

## 2011-11-19 NOTE — ED Provider Notes (Signed)
History     CSN: [id number]  Arrival date & time 11/19/11  1157   First MD Initiated Contact with Patient 11/19/11 1457      Chief Complaint  Patient presents with  . Shortness of Breath    (Consider location/radiation/quality/duration/timing/severity/associated sxs/prior treatment) HPI Comments: Patient presents to the ED with shortness of breath, dyspnea on exertion, cough and started on September 24. She saw her Dr. on October 4 and was told she might have bronchitis was started on amoxicillin. She was still feeling poorly and saw him again on October 7 and was switched to Augmentin and started on steroids. Last night she developed worsening shortness of breath, cough, fever and chills and nausea. She denies any cardiac or pulmonary history. She denies any lower 70s swelling or pain. On arrival she is febrile, tachycardic and hypoxic.  The history is provided by the patient.    Past Medical History  Diagnosis Date  . Obesity   . Hypertension   . Fibroids uterine  . Suppurative hidradenitis     w/ prior multiple abscess, MRSA +  . Sinus problem     twice yearly   . Anemia     iron deficient, since teenage years  . Nearsightedness     wears glasses  . Genital HSV   . Urinary incontinence   . Normal labor and delivery     5/92 and 8/95    Past Surgical History  Procedure Date  . Myomectomy 2005  . Bilateral tubal ligation   . Wisdom tooth extraction   . Flexible sigmoidoscopy 2005    due to abdominal pain; normal    Family History  Problem Relation Age of Onset  . Diabetes Neg Hx   . Stroke Neg Hx   . Cancer Neg Hx   . Heart disease Neg Hx   . Hypertension Neg Hx   . Hyperlipidemia Mother     History  Substance Use Topics  . Smoking status: Never Smoker   . Smokeless tobacco: Never Used  . Alcohol Use: No     rare    OB History    Grav Para Term Preterm Abortions TAB SAB Ect Mult Living                  Review of Systems  Constitutional:  Positive for fever, activity change, appetite change and fatigue.  HENT: Negative for congestion and rhinorrhea.   Respiratory: Positive for cough and shortness of breath.   Cardiovascular: Positive for chest pain.  Gastrointestinal: Negative for nausea, vomiting and abdominal pain.  Genitourinary: Negative for dysuria and hematuria.  Musculoskeletal: Negative for back pain.  Skin: Negative for rash.  Neurological: Positive for weakness. Negative for dizziness and headaches.    Allergies  Review of patient's allergies indicates no known allergies.  Home Medications   Current Outpatient Rx  Name Route Sig Dispense Refill  . ALBUTEROL SULFATE HFA 108 (90 BASE) MCG/ACT IN AERS Inhalation Inhale 2 puffs into the lungs every 6 (six) hours as needed for wheezing. 1 Inhaler 0  . AMOXICILLIN-POT CLAVULANATE 875-125 MG PO TABS Oral Take 1 tablet by mouth 2 (two) times daily. 20 tablet 0  . BISOPROLOL-HYDROCHLOROTHIAZIDE 10-6.25 MG PO TABS Oral Take 1 tablet by mouth daily. 90 tablet 3  . NAPROXEN SODIUM 220 MG PO TABS Oral Take 220 mg by mouth 2 (two) times daily as needed. For general aches and pains    . VALACYCLOVIR HCL 1 G PO TABS Oral Take  1,000 mg by mouth 2 (two) times daily as needed. For outbreak      BP 103/63  Pulse 84  Temp 100.3 F (37.9 C) (Oral)  Resp 20  SpO2 93%  LMP 11/02/2011  Physical Exam  Constitutional: She is oriented to person, place, and time. She appears well-developed and well-nourished. No distress.       Uncomfortable, mild tachypnea  HENT:  Head: Normocephalic and atraumatic.  Mouth/Throat: Oropharynx is clear and moist. No oropharyngeal exudate.  Eyes: Conjunctivae normal and EOM are normal. Pupils are equal, round, and reactive to light.  Neck: Normal range of motion. Neck supple.  Cardiovascular: Normal rate, regular rhythm and normal heart sounds.   No murmur heard. Pulmonary/Chest: Effort normal and breath sounds normal. No respiratory distress.         Rhonchi bilaterally  Abdominal: Soft. Bowel sounds are normal. There is no tenderness. There is no rebound and no guarding.  Musculoskeletal: Normal range of motion. She exhibits no edema and no tenderness.  Neurological: She is alert and oriented to person, place, and time. No cranial nerve deficit.  Skin: Skin is warm.    ED Course  Procedures (including critical care time)  Labs Reviewed  CBC WITH DIFFERENTIAL - Abnormal; Notable for the following:    WBC 13.3 (*)     Hemoglobin 11.2 (*)     HCT 33.6 (*)     MCV 67.5 (*)     MCH 22.5 (*)     Platelets 475 (*)     Neutrophils Relative 85 (*)     Lymphocytes Relative 8 (*)     Neutro Abs 11.3 (*)     All other components within normal limits  COMPREHENSIVE METABOLIC PANEL - Abnormal; Notable for the following:    Sodium 132 (*)     Glucose, Bld 112 (*)     Total Protein 8.6 (*)     Albumin 3.0 (*)     All other components within normal limits  LACTIC ACID, PLASMA  URINALYSIS, ROUTINE W REFLEX MICROSCOPIC   Dg Chest 2 View  11/19/2011  *RADIOLOGY REPORT*  Clinical Data: Chest pain, cough, body aches, shortness of breath and fever.  CHEST - 2 VIEW  Comparison: None.  Findings: Trachea is midline.  Heart is at the upper limits normal in size and accentuated by low lung volumes.  Mild diffuse bilateral air space disease.  No pleural fluid.  IMPRESSION: Mild diffuse bilateral air space disease is suspicious for viral or atypical pneumonia.  Edema is not excluded.   Original Report Authenticated By: Reyes Ivan, M.D.      No diagnosis found.    MDM  2 weeks of respiratory symptoms of cough, fever, shortness of breath. Has failed outpatient antibiotics. Arrives febrile, hypoxic and tachycardic.  Patient's bilateral multifocal pneumonia with fever, tachycardia and hypoxia. She has failed outpatient treatment. We'll initiate IV antibiotics, supplemental O2, IV hydration.  PCP pneumonia is considered given patient's  lack of response to antibiotics and chest x-ray appearance. Dr. Lavera Guise agrees to admit patient to step down unit. He states to hold off on empiric PCP coverage.   Date: 11/19/2011  Rate: 85  Rhythm: normal sinus rhythm  QRS Axis: normal  Intervals: normal  ST/T Wave abnormalities: normal  Conduction Disutrbances:none  Narrative Interpretation:   Old EKG Reviewed: none available  CRITICAL CARE Performed by: Glynn Octave   Total critical care time: 30  Critical care time was exclusive of separately billable procedures  and treating other patients.  Critical care was necessary to treat or prevent imminent or life-threatening deterioration.  Critical care was time spent personally by me on the following activities: development of treatment plan with patient and/or surrogate as well as nursing, discussions with consultants, evaluation of patient's response to treatment, examination of patient, obtaining history from patient or surrogate, ordering and performing treatments and interventions, ordering and review of laboratory studies, ordering and review of radiographic studies, pulse oximetry and re-evaluation of patient's condition.       Glynn Octave, MD 11/19/11 1723

## 2011-11-19 NOTE — ED Notes (Signed)
Joselyn Glassman - patient's son - [phone]   Corrie Dandy - patient's mother - [phone]

## 2011-11-19 NOTE — Progress Notes (Signed)
Pt admitted from ED. VSS, pt oriented to room, call bell at pt's side.  Order for flu pcr to be done, pt to be placed on droplet precautions per protocol.    Roselie Awkward, RN

## 2011-11-19 NOTE — ED Notes (Signed)
Patient resting with eyes closed.  IV Zithromax infusing without difficulty.  Remains on ED monitors.

## 2011-11-19 NOTE — Progress Notes (Signed)
Pt assessed by RT, very diminished breath sounds bilaterally, with some expiratory wheeze. Pt states she does have a productive cough which produces white, thick but frothy sputum. Pt states she has noticed a decrease in her urine output. After 5mg  Albuterol neb, breath sounds are still diminished bilaterally. RT will continue to monitor.

## 2011-11-19 NOTE — H&P (Signed)
[facility] History and Physical  Teresa Doyle ZOX:[id number] DOB: 1965-07-10 DOA: 11/19/2011   PCP: Carollee Herter, MD   Chief Complaint:  Chief Complaint  Patient presents with  . Shortness of Breath     HPI: Teresa Doyle is a 46 y.o. female with 3 weeks symptoms of dyspnea, cough with frothy sputum production . The patient has already been to her primary care physician twice for the symptoms and has been on a course of antibiotics for the past week. She continues to have fevers and chills and she was getting progressively more short of breath. She denies any chemical exposure, recent travel, tick bites. Up to getting the symptoms she's been in perfect health according to her. Reviewing the records so looks like she has had MRSA positive adenitis and genital herpes in the past.  Review of Systems: The patient denies anorexia,  weight loss,, vision loss, decreased hearing, hoarseness, chest pain, syncope,  peripheral edema, balance deficits, hemoptysis, abdominal pain, melena, hematochezia, severe indigestion/heartburn, hematuria, incontinence, genital sores, muscle weakness, suspicious skin lesions, transient blindness, difficulty walking, depression, unusual weight change, abnormal bleeding, enlarged lymph nodes, angioedema, and breast masses.    Past Medical History  Diagnosis Date  . Obesity   . Hypertension   . Fibroids uterine  . Suppurative hidradenitis     w/ prior multiple abscess, MRSA +  . Sinus problem     twice yearly   . Anemia     iron deficient, since teenage years  . Nearsightedness     wears glasses  . Genital HSV   . Urinary incontinence   . Normal labor and delivery     5/92 and 8/95   Past Surgical History  Procedure Date  . Myomectomy 2005  . Bilateral tubal ligation   . Wisdom tooth extraction   . Flexible sigmoidoscopy 2005    due to abdominal pain; normal   Social History:  reports that she has never smoked. She has never used  smokeless tobacco. She reports that she does not drink alcohol or use illicit drugs. She works as a CMA  No Known Allergies  Family History  Problem Relation Age of Onset  . Diabetes Neg Hx   . Stroke Neg Hx   . Cancer Neg Hx   . Heart disease Neg Hx   . Hypertension Neg Hx   . Hyperlipidemia Mother     Prior to Admission medications   Medication Sig Start Date End Date Taking? Authorizing Provider  albuterol (PROVENTIL HFA;VENTOLIN HFA) 108 (90 BASE) MCG/ACT inhaler Inhale 2 puffs into the lungs every 6 (six) hours as needed for wheezing. 11/14/11  Yes Ronnald Nian, MD  amoxicillin-clavulanate (AUGMENTIN) 875-125 MG per tablet Take 1 tablet by mouth 2 (two) times daily. 11/17/11 11/27/11 Yes Ronnald Nian, MD  bisoprolol-hydrochlorothiazide [facility]) 10-6.25 MG per tablet Take 1 tablet by mouth daily. 12/30/10  Yes Joselyn Arrow, MD  naproxen sodium (ANAPROX) 220 MG tablet Take 220 mg by mouth 2 (two) times daily as needed. For general aches and pains   Yes Historical Provider, MD  valACYclovir (VALTREX) 1000 MG tablet Take 1,000 mg by mouth 2 (two) times daily as needed. For outbreak   Yes Historical Provider, MD   Physical Exam: Filed Vitals:   11/19/11 1600 11/19/11 1630 11/19/11 1700 11/19/11 1730  BP: 98/58 94/56 111/64 97/61  Pulse: 94 97 104 96  Temp:      TempSrc:      Resp:  37  21 35  Height: 5' 1.81" (1.57 m)     Weight: 95.3 kg (210 lb 1.6 oz)     SpO2: 97% 94% 93% 94%     General:  Alert and oriented x3  Eyes: Pupil equal and round react to light accommodation, extraocular movement intact  ENT: Clear pharynx  Neck: No JVD  Cardiovascular: Regular rate and rhythm without murmurs rubs or gallops  Respiratory: Bilateral crackles right greater than left no wheezes no rhonchi  Abdomen: Soft nontender bowel sounds are present no palpable hepatosplenomegaly  Skin: Pale and dry  Musculoskeletal: Intact muscle bulk and tone  Psychiatric: Euthymic  Neurologic:  Cranial nerves 2-12 intact, strength 5 out of 5 in all 4 extremities, sensation intact  Labs on Admission:  Basic Metabolic Panel:  Lab 11/19/11 1610 11/14/11 1137  NA 132* 138  K 3.8 3.5  CL 96 100  CO2 24 26  GLUCOSE 112* 106*  BUN 10 10  CREATININE 0.73 0.78  CALCIUM 9.3 8.9  MG -- --  PHOS -- --   Liver Function Tests:  Lab 11/19/11 1248  AST 35  ALT 8  ALKPHOS 70  BILITOT 0.4  PROT 8.6*  ALBUMIN 3.0*   No results found for this basename: LIPASE:5,AMYLASE:5 in the last 168 hours No results found for this basename: AMMONIA:5 in the last 168 hours CBC:  Lab 11/19/11 1248 11/14/11 1137  WBC 13.3* 5.9  NEUTROABS 11.3* 4.1  HGB 11.2* 10.1*  HCT 33.6* 30.6*  MCV 67.5* 66.4*  PLT 475* 335   Cardiac Enzymes: No results found for this basename: CKTOTAL:5,CKMB:5,CKMBINDEX:5,TROPONINI:5 in the last 168 hours  BNP (last 3 results) No results found for this basename: PROBNP:3 in the last 8760 hours CBG: No results found for this basename: GLUCAP:5 in the last 168 hours  Radiological Exams on Admission: Dg Chest 2 View  11/19/2011  *RADIOLOGY REPORT*  Clinical Data: Chest pain, cough, body aches, shortness of breath and fever.  CHEST - 2 VIEW  Comparison: None.  Findings: Trachea is midline.  Heart is at the upper limits normal in size and accentuated by low lung volumes.  Mild diffuse bilateral air space disease.  No pleural fluid.  IMPRESSION: Mild diffuse bilateral air space disease is suspicious for viral or atypical pneumonia.  Edema is not excluded.   Original Report Authenticated By: Reyes Ivan, M.D.       Assessment/Plan Principal Problem:  *Acute respiratory failure Active Problems:  Anemia  Bilateral pneumonia  Hyponatremia   1. Acute hypoxemic respiratory failure most likely due to bilateral pneumonia. Given the constellation of symptoms of fever, chills, sputum production, leukocytosis I suspect etiology is infectious. I doubt patient has CHF or  chemical pneumonitis. We have sent blood cultures, sputum culture, HIV, urine Legionella antigen, urine streptococcal antigen, sputum for pneumocystis DFA and start empiric antibiotics with Rocephin and azithromycin and vancomycin. Further plans of care depending on how the patient progresses.   Code Status: full code Family Communication: patient  Disposition Plan: anticipated LOS 4 days - return home   Time spent: 1 hour  Nyelah Emmerich [facility] Pager [phone]  If 7PM-7AM, please contact night-coverage [email or web address] Password [email or web address] 11/19/2011, 6:16 PM

## 2011-11-20 DIAGNOSIS — E860 Dehydration: Secondary | ICD-10-CM | POA: Diagnosis present

## 2011-11-20 DIAGNOSIS — B20 Human immunodeficiency virus [HIV] disease: Secondary | ICD-10-CM | POA: Diagnosis present

## 2011-11-20 DIAGNOSIS — F411 Generalized anxiety disorder: Secondary | ICD-10-CM | POA: Diagnosis present

## 2011-11-20 DIAGNOSIS — D72829 Elevated white blood cell count, unspecified: Secondary | ICD-10-CM | POA: Diagnosis present

## 2011-11-20 LAB — CBC
HCT: 29.9 % — ABNORMAL LOW (ref 36.0–46.0)
Hemoglobin: 9.6 g/dL — ABNORMAL LOW (ref 12.0–15.0)
MCV: 67.6 fL — ABNORMAL LOW (ref 78.0–100.0)
RBC: 4.42 MIL/uL (ref 3.87–5.11)
RDW: 15.1 % (ref 11.5–15.5)
WBC: 10.8 10*3/uL — ABNORMAL HIGH (ref 4.0–10.5)

## 2011-11-20 LAB — BASIC METABOLIC PANEL
BUN: 7 mg/dL (ref 6–23)
CO2: 21 mEq/L (ref 19–32)
Calcium: 8.7 mg/dL (ref 8.4–10.5)
Chloride: 101 mEq/L (ref 96–112)
Creatinine, Ser: 0.66 mg/dL (ref 0.50–1.10)
GFR calc Af Amer: >90 mL/min (ref >90)
GFR calc non Af Amer: >90 mL/min (ref >90)
Glucose, Bld: 109 mg/dL — ABNORMAL HIGH (ref 70–99)
Potassium: 3.6 mEq/L (ref 3.5–5.1)
Sodium: 133 mEq/L — ABNORMAL LOW (ref 135–145)

## 2011-11-20 LAB — GRAM STAIN: Special Requests: NORMAL

## 2011-11-20 LAB — EXPECTORATED SPUTUM ASSESSMENT W GRAM STAIN, RFLX TO RESP C: Special Requests: NORMAL

## 2011-11-20 LAB — INFLUENZA PANEL BY PCR (TYPE A & B): H1N1 flu by pcr: NOT DETECTED

## 2011-11-20 LAB — PNEUMOCYSTIS JIROVECI SMEAR BY DFA: Pneumocystis jiroveci Ag: POSITIVE

## 2011-11-20 LAB — HIV ANTIBODY (ROUTINE TESTING W REFLEX): HIV: REACTIVE — AB

## 2011-11-20 MED ORDER — METHYLPREDNISOLONE SODIUM SUCC 40 MG IJ SOLR
30.0000 mg | Freq: Two times a day (BID) | INTRAMUSCULAR | Status: DC
  Administered 2011-11-20 – 2011-11-24 (×8): 30 mg via INTRAVENOUS
  Filled 2011-11-20 (×10): qty 0.75

## 2011-11-20 MED ORDER — SULFAMETHOXAZOLE-TRIMETHOPRIM 400-80 MG/5ML IV SOLN
480.0000 mg | Freq: Four times a day (QID) | INTRAVENOUS | Status: DC
  Administered 2011-11-20 – 2011-11-25 (×18): 480 mg via INTRAVENOUS
  Filled 2011-11-20 (×43): qty 30

## 2011-11-20 MED ORDER — METHYLPREDNISOLONE SODIUM SUCC 125 MG IJ SOLR
125.0000 mg | Freq: Once | INTRAMUSCULAR | Status: AC
  Administered 2011-11-20: 125 mg via INTRAMUSCULAR

## 2011-11-20 MED ORDER — ALPRAZOLAM 0.25 MG PO TABS
0.2500 mg | ORAL_TABLET | Freq: Three times a day (TID) | ORAL | Status: DC | PRN
  Administered 2011-11-22: 0.25 mg via ORAL
  Filled 2011-11-20: qty 1

## 2011-11-20 NOTE — Progress Notes (Signed)
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 She presented to the emergency department with 3 weeks of dyspnea as well as cough with frothy sputum production. The patient has previously been treated by her primary care physician twice for apparent bacterial pneumonia as seen no improvement in her symptoms. Because she continued with fevers and chills and was experiencing severe shortness of breath she presented to the emergency department. Chest x-ray in the ER revealed severe bilateral pneumonia and patient was hypoxic and had significant dyspnea on exertion with minimal movement such as repositioning herself in the bed. Because of her symptomatology she was admitted to the step down unit for further monitoring and treatment  Assessment/Plan: Principal Problem:  *Acute respiratory failure with hypoxia 2/2 Severe Bilateral pneumonia-? PCP *Initially started on broad-spectrum antibiotics to treat recalcitrant community acquired pneumonia *Infectious disease will treat her PCP *Started on IV Solu-Medrol and given orders for pharmacy to dose Bactrim *Infectious disease states patient probably will worsen over the next 48 hours *Sputum cultures are pending- flu PCR negative; strep pneumo antigen negative; sputum for cx and PCP sent to lab but specimen may not have been adequate  Active Problems:  042 Ab positive-confirmatory pending *We have ordered a Western blot as well as a full CD4 count *Appreciate infectious disease service assistance   Hyponatremia/Dehydration *Continue IV fluid at 50 cc per hour *Follow electrolytes   Anxiety *Related to ongoing illness and symptoms as well as potential devastating new diagnosis *Patient did eventually speak with the chaplain service and according to the nurse patient  appears to be doing a little bit better with the recent news   Leukocytosis/fever *Related to acute infection as well as dehydration and stress *Treat symptoms *Follow CBC   Anemia *Hemoglobin stable at 9.6 after hydration  Essential hypertension, benign   DVT prophylaxis: Lovenox Code Status: Full Family Communication: Spoke directly to patient. Nurses tell me patient's mother called the unit and was quite distressed over her daughter's recent illness and lack of response to treatment. She apparently was requesting a formal pulmonology consult and was questioning why the patient was resumed on antibiotics "when they didn't work before". I spoke with the patient who clarified that she did not want myself or other medical staff giving any information regarding her health status to anyone including her mother. Patient states that her mother needed to know anything she would tell her. Disposition Plan: Remain in step down  Consultants: Infectious disease  Procedures: None  Antibiotics: Zithromax 10/9 >>> Rocephin 10/9 >>> Vancomycin 10/9 >>> 10/10  HPI/Subjective: Patient is alert and endorses becomes quite dyspneic with even minimal activity. Confirms prolonged history of upper respiratory infection. Denied issues with unexplained adenopathy. Denies issues with recurrent illnesses or infections. Describes episode about 3 years ago where she was awakened with fever and chills and developed thrombocytopenia and was felt to have an idiopathic viral illness at that time.   Objective: Blood pressure 147/83, pulse 89, temperature 98.9 F (37.2 C), temperature source Oral, resp. rate 22, height 5\' 2"  (1.575 m), weight 93.5 kg (206 lb 2.1 oz), last menstrual period 11/02/2011, SpO2 97.00%.  Intake/Output Summary (Last 24 hours) at 11/20/11 1514 Last data filed at 11/20/11 1400  Gross per 24 hour  Intake   2265 ml  Output   1150 ml  Net  1115 ml     Exam: General: No acute  respiratory distress Lungs: Coarse with bilateral crackles and labored respiratory effort after minimal activity, occasional slightly productive cough, nasal cannula oxygen 3 L per minute with 97% that she rations Cardiovascular: Regular rate and rhythm without murmur gallop or rub normal S1 and S2, IV fluid at 50 cc per hour Abdomen: Nontender, nondistended, soft, bowel sounds positive, no rebound, no ascites, no appreciable mass Musculoskeletal: No significant cyanosis, clubbing of bilateral lower extremities Neurological: Patient is alert and oriented x3 moves all extremities x4 without any focal neurological deficits appreciated  Data Reviewed: Basic Metabolic Panel:  Lab 11/20/11 1610 11/19/11 1248 11/14/11 1137  NA 133* 132* 138  K 3.6 3.8 3.5  CL 101 96 100  CO2 21 24 26   GLUCOSE 109* 112* 106*  BUN 7 10 10   CREATININE 0.66 0.73 0.78  CALCIUM 8.7 9.3 8.9  MG -- -- --  PHOS -- -- --   Liver Function Tests:  Lab 11/19/11 1248  AST 35  ALT 8  ALKPHOS 70  BILITOT 0.4  PROT 8.6*  ALBUMIN 3.0*   No results found for this basename: LIPASE:5,AMYLASE:5 in the last 168 hours No results found for this basename: AMMONIA:5 in the last 168 hours CBC:  Lab 11/20/11 0450 11/19/11 1248 11/14/11 1137  WBC 10.8* 13.3* 5.9  NEUTROABS -- 11.3* 4.1  HGB 9.6* 11.2* 10.1*  HCT 29.9* 33.6* 30.6*  MCV 67.6* 67.5* 66.4*  PLT 413* 475* 335   Cardiac Enzymes: No results found for this basename: CKTOTAL:5,CKMB:5,CKMBINDEX:5,TROPONINI:5 in the last 168 hours BNP (last 3 results) No results found for this basename: PROBNP:3 in the last 8760 hours CBG: No results found for this basename: GLUCAP:5 in the last 168 hours  Recent Results (from the past 240 hour(s))  CULTURE, BLOOD (ROUTINE X 2)     Status: Normal (Preliminary result)   Collection Time   11/19/11  5:04 PM      Component Value Range Status Comment   Specimen Description BLOOD ARM LEFT   Final    Special Requests     Final     Value: BOTTLES DRAWN AEROBIC AND ANAEROBIC 10CC EA PATIENT ON FOLLOWING ROCEPHIN,ZITHROMAX   Culture  Setup Time 11/19/2011 21:17   Final    Culture     Final    Value:        BLOOD CULTURE RECEIVED NO GROWTH TO DATE CULTURE WILL BE HELD FOR 5 DAYS BEFORE ISSUING A FINAL NEGATIVE REPORT   Report Status PENDING   Incomplete   CULTURE, BLOOD (ROUTINE X 2)     Status: Normal (Preliminary result)   Collection Time   11/19/11  5:13 PM      Component Value Range Status Comment   Specimen Description BLOOD HAND LEFT   Final    Special Requests     Final    Value: BOTTLES DRAWN AEROBIC AND ANAEROBIC 10CC EA PATIENT ON FOLLOWING ROCEPHIN,ZITHROMAX   Culture  Setup Time 11/19/2011 21:13   Final    Culture     Final    Value:        BLOOD CULTURE RECEIVED NO GROWTH TO DATE CULTURE WILL BE HELD FOR 5 DAYS BEFORE ISSUING A FINAL NEGATIVE REPORT   Report Status PENDING   Incomplete   MRSA PCR SCREENING     Status: Normal   Collection Time   11/19/11  6:11 PM      Component Value Range Status Comment  MRSA by PCR NEGATIVE  NEGATIVE Final   CULTURE, EXPECTORATED SPUTUM-ASSESSMENT     Status: Normal   Collection Time   11/20/11  8:00 AM      Component Value Range Status Comment   Specimen Description SPUTUM   Final    Special Requests Normal   Final    Sputum evaluation     Final    Value: MICROSCOPIC FINDINGS SUGGEST THAT THIS SPECIMEN IS NOT REPRESENTATIVE OF LOWER RESPIRATORY SECRETIONS. PLEASE RECOLLECT.     Gram Stain Report Called to,Read Back By and Verified With: Chelsea RN 10:05 11/20/11 ([email or web address])   Report Status 11/20/2011 FINAL   Final   GRAM STAIN     Status: Normal   Collection Time   11/20/11  8:00 AM      Component Value Range Status Comment   Specimen Description SPUTUM   Final    Special Requests Normal   Final    Gram Stain     Final    Value: ABUNDANT SQUAMOUS EPITHELIAL CELLS PRESENT     MODERATE WBC PRESENT,BOTH PMN AND MONONUCLEAR     ABUNDANT GRAM POSITIVE AND GRAM  NEGATIVE BACTERIA CONSISTENT WITH NORMAL OROPHARYNGEAL FLORA     NO YEAST OR FUNGAL ELEMENTS SEEN   Report Status 11/20/2011 FINAL   Final      Studies:  Recent x-ray studies have been reviewed in detail by the Attending Physician  Scheduled Meds:  Reviewed in detail by the Attending Physician   Junious Silk, ANP [facility] Office  [phone] Pager [phone]  On-Call/Text Page:      [email or web address]      password [email or web address]  If 7PM-7AM, please contact night-coverage [email or web address] Password [email or web address] 11/20/2011, 3:14 PM   LOS: 1 day   I have examined the patient, reviewed the chart and modified the above note which I agree with.   Calvert Cantor, MD [phone]

## 2011-11-20 NOTE — Addendum Note (Signed)
Addended by: Lavell Islam on: 11/20/2011 07:44 AM   Modules accepted: Orders

## 2011-11-20 NOTE — Progress Notes (Signed)
Utilization review completed.  

## 2011-11-20 NOTE — Progress Notes (Signed)
I went back to see pt as promised and she was resting. She said she had been up all day and asked if I would be here tomorrow. I told her I would ck on her tomorrow.  Marjory Lies Chaplain

## 2011-11-20 NOTE — Progress Notes (Signed)
CRITICAL VALUE ALERT  Critical value received:  Pneumocystis  Date of notification:  11/20/2011  Time of notification:  5:17 PM  Critical value read back:yes  Nurse who received alert:  Eda Paschal, RN  MD notified (1st page):  Rizwan  Time of first page:  5:27 PM  Responding MD:  N/A treated already started for suspected PCP.   Time MD responded:  5:29 PM

## 2011-11-20 NOTE — Progress Notes (Signed)
ANTIBIOTIC CONSULT NOTE - INITIAL  Pharmacy Consult for Septra Indication: r/o PCP  No Known Allergies  Patient Measurements: Height: 5\' 2"  (157.5 cm) Weight: 206 lb 2.1 oz (93.5 kg) IBW/kg (Calculated) : 50.1   Vital Signs: Temp: 98.9 F (37.2 C) (10/10 1200) Temp src: Oral (10/10 1200) BP: 147/83 mmHg (10/10 1200) Pulse Rate: 89  (10/10 1200) Intake/Output from previous day: 10/09 0701 - 10/10 0700 In: 875 [I.V.:675; IV Piggyback:200] Out: 300 [Urine:300] Intake/Output from this shift: Total I/O In: 1390 [P.O.:840; I.V.:350; IV Piggyback:200] Out: 850 [Urine:850]  Labs:  [facility] 11/20/11 0450 11/19/11 1248  WBC 10.8* 13.3*  HGB 9.6* 11.2*  PLT 413* 475*  LABCREA -- --  CREATININE 0.66 0.73   Estimated Creatinine Clearance: 93.6 ml/min (by C-G formula based on Cr of 0.66). No results found for this basename: VANCOTROUGH:2,VANCOPEAK:2,VANCORANDOM:2,GENTTROUGH:2,GENTPEAK:2,GENTRANDOM:2,TOBRATROUGH:2,TOBRAPEAK:2,TOBRARND:2,AMIKACINPEAK:2,AMIKACINTROU:2,AMIKACIN:2, in the last 72 hours   Microbiology: Recent Results (from the past 720 hour(s))  CULTURE, BLOOD (ROUTINE X 2)     Status: Normal (Preliminary result)   Collection Time   11/19/11  5:04 PM      Component Value Range Status Comment   Specimen Description BLOOD ARM LEFT   Final    Special Requests     Final    Value: BOTTLES DRAWN AEROBIC AND ANAEROBIC 10CC EA PATIENT ON FOLLOWING ROCEPHIN,ZITHROMAX   Culture  Setup Time 11/19/2011 21:17   Final    Culture     Final    Value:        BLOOD CULTURE RECEIVED NO GROWTH TO DATE CULTURE WILL BE HELD FOR 5 DAYS BEFORE ISSUING A FINAL NEGATIVE REPORT   Report Status PENDING   Incomplete   CULTURE, BLOOD (ROUTINE X 2)     Status: Normal (Preliminary result)   Collection Time   11/19/11  5:13 PM      Component Value Range Status Comment   Specimen Description BLOOD HAND LEFT   Final    Special Requests     Final    Value: BOTTLES DRAWN AEROBIC AND ANAEROBIC 10CC EA  PATIENT ON FOLLOWING ROCEPHIN,ZITHROMAX   Culture  Setup Time 11/19/2011 21:13   Final    Culture     Final    Value:        BLOOD CULTURE RECEIVED NO GROWTH TO DATE CULTURE WILL BE HELD FOR 5 DAYS BEFORE ISSUING A FINAL NEGATIVE REPORT   Report Status PENDING   Incomplete   MRSA PCR SCREENING     Status: Normal   Collection Time   11/19/11  6:11 PM      Component Value Range Status Comment   MRSA by PCR NEGATIVE  NEGATIVE Final   CULTURE, EXPECTORATED SPUTUM-ASSESSMENT     Status: Normal   Collection Time   11/20/11  8:00 AM      Component Value Range Status Comment   Specimen Description SPUTUM   Final    Special Requests Normal   Final    Sputum evaluation     Final    Value: MICROSCOPIC FINDINGS SUGGEST THAT THIS SPECIMEN IS NOT REPRESENTATIVE OF LOWER RESPIRATORY SECRETIONS. PLEASE RECOLLECT.     Gram Stain Report Called to,Read Back By and Verified With: Chelsea RN 10:05 11/20/11 ([email or web address])   Report Status 11/20/2011 FINAL   Final   GRAM STAIN     Status: Normal   Collection Time   11/20/11  8:00 AM      Component Value Range Status Comment   Specimen Description SPUTUM  Final    Special Requests Normal   Final    Gram Stain     Final    Value: ABUNDANT SQUAMOUS EPITHELIAL CELLS PRESENT     MODERATE WBC PRESENT,BOTH PMN AND MONONUCLEAR     ABUNDANT GRAM POSITIVE AND GRAM NEGATIVE BACTERIA CONSISTENT WITH NORMAL OROPHARYNGEAL FLORA     NO YEAST OR FUNGAL ELEMENTS SEEN   Report Status 11/20/2011 FINAL   Final     Medical History: Past Medical History  Diagnosis Date  . Obesity   . Hypertension   . Fibroids uterine  . Suppurative hidradenitis     w/ prior multiple abscess, MRSA +  . Sinus problem     twice yearly   . Anemia     iron deficient, since teenage years  . Nearsightedness     wears glasses  . Genital HSV   . Urinary incontinence   . Normal labor and delivery     5/92 and 8/95    Medications:  Scheduled:    . albuterol  5 mg Nebulization Once  .  azithromycin  500 mg Intravenous Q24H  . cefTRIAXone (ROCEPHIN)  IV  1 g Intravenous Once  . cefTRIAXone (ROCEPHIN)  IV  1 g Intravenous Q24H  . enoxaparin (LOVENOX) injection  40 mg Subcutaneous Q24H  . methylPREDNISolone (SOLU-MEDROL) injection  30 mg Intravenous Q12H  . sodium chloride  1,000 mL Intravenous Once  . sodium chloride  3 mL Intravenous Q12H  . vancomycin  1,000 mg Intravenous Once  . DISCONTD: sodium chloride   Intravenous Once  . DISCONTD: sodium chloride   Intravenous STAT  . DISCONTD: vancomycin  1,000 mg Intravenous Q8H   Infusions:    . sodium chloride 50 mL/hr at 11/20/11 1400   Assessment: 46 y/o female patient admitted with sob, now with probably pcp given HIV ab detected. Will cover pcp with septra and continue to follow.  Plan:  Septra 480mg  IV q6h (20mg /kg/day tmp). Monitor renal functions and c/s.  Verlene Mayer, PharmD, BCPS Pager [phone] 11/20/2011,3:41 PM

## 2011-11-20 NOTE — Progress Notes (Signed)
After introducing myself, I told pt of my knowledge of the news she had recently learned concerning her health. I asked if this was a good time to visit. She said she would like to talk later and she began to cry. I held her hand and realized she didn't have tissue and left to get tissue. When I got back she said there is so much. We talked about her recent report of her health. She said they were running a second test. Pt was very concerned about her children and who will take care of them and what will happen. She has very strong faith and at want point emphatically said I will live and not die. After a several minutes of conversation, I offered to join my faith with hers and pray for her. She was very thankful for the care and visit.  Marjory Lies Chaplain

## 2011-11-20 NOTE — Consult Note (Signed)
[facility]     Reason for Consult: HIV Ab positive, ? PCP pneumonia    Referring Physician: Dr. Lavera Guise  Principal Problem:  *Acute respiratory failure Active Problems:  Anemia  Bilateral pneumonia  Hyponatremia      . sodium chloride   Intravenous Once  . albuterol  5 mg Nebulization Once  . azithromycin  500 mg Intravenous Q24H  . cefTRIAXone (ROCEPHIN)  IV  1 g Intravenous Once  . cefTRIAXone (ROCEPHIN)  IV  1 g Intravenous Q24H  . enoxaparin (LOVENOX) injection  40 mg Subcutaneous Q24H  . sodium chloride  1,000 mL Intravenous Once  . sodium chloride  3 mL Intravenous Q12H  . vancomycin  1,000 mg Intravenous Once  . vancomycin  1,000 mg Intravenous Q8H  . DISCONTD: sodium chloride   Intravenous Once  . DISCONTD: sodium chloride   Intravenous STAT    Recommendations: CAP treatment with azithromycin and ceftriaxone Bactrim and steroids for possible PCP pneumonia  HIV VL for confirmation  Assessment: She has significant respiratory distress with movement and though not significantly hypoxic at rest, she certainly is with movement, desating to 80s.  With the positive HIV Ab, I am very concerned with PCP pneumonia and will start her on therapy for both.  If it is PCP, she can continue to get worse over the next 48 hours before getting better.    Antibiotics: Vancomycin 10/9 - 10/10 Ceftriaxone 10/9 -  Azithromycin 10/9 -  BActrim 10/10 - Steroids 10/10 -  HPI: TIARIA GARSKE is a 46 y.o. female with a history of genital herpes who presented to ED with 3 weeks of progressively worsening SOB, cough and fever.  Initially started in Sept and did receive and antibiotic.  She had progressive symptoms.  No hemoptysis.  Worsening SOB with activity.  No history of HIV test but now is Ab positive, confirmatory WB pending.  She does acknowledge a sexual contact of unprotected sex in her past.  No rashes, no diarrhea, no weight loss.     Review of  Systems: Pertinent items are noted in HPI.  Past Medical History  Diagnosis Date  . Obesity   . Hypertension   . Fibroids uterine  . Suppurative hidradenitis     w/ prior multiple abscess, MRSA +  . Sinus problem     twice yearly   . Anemia     iron deficient, since teenage years  . Nearsightedness     wears glasses  . Genital HSV   . Urinary incontinence   . Normal labor and delivery     5/92 and 8/95    History  Substance Use Topics  . Smoking status: Never Smoker   . Smokeless tobacco: Never Used  . Alcohol Use: No     rare    Family History  Problem Relation Age of Onset  . Diabetes Neg Hx   . Stroke Neg Hx   . Cancer Neg Hx   . Heart disease Neg Hx   . Hypertension Neg Hx   . Hyperlipidemia Mother    No Known Allergies  OBJECTIVE: Blood pressure 147/83, pulse 89, temperature 98.9 F (37.2 C), temperature source Oral, resp. rate 22, height 5\' 2"  (1.575 m), weight 206 lb 2.1 oz (93.5 kg), last menstrual period 11/02/2011, SpO2 97.00%. General: Awake, alert, mod respiratory distress Skin: no rashes Lungs: crackles R>L Cor: Tachy, rr Abdomen: soft, nt, nd Ext - no edema  Microbiology: Recent Results (from  the past 240 hour(s))  CULTURE, BLOOD (ROUTINE X 2)     Status: Normal (Preliminary result)   Collection Time   11/19/11  5:04 PM      Component Value Range Status Comment   Specimen Description BLOOD ARM LEFT   Final    Special Requests     Final    Value: BOTTLES DRAWN AEROBIC AND ANAEROBIC 10CC EA PATIENT ON FOLLOWING ROCEPHIN,ZITHROMAX   Culture  Setup Time 11/19/2011 21:17   Final    Culture     Final    Value:        BLOOD CULTURE RECEIVED NO GROWTH TO DATE CULTURE WILL BE HELD FOR 5 DAYS BEFORE ISSUING A FINAL NEGATIVE REPORT   Report Status PENDING   Incomplete   CULTURE, BLOOD (ROUTINE X 2)     Status: Normal (Preliminary result)   Collection Time   11/19/11  5:13 PM      Component Value Range Status Comment   Specimen Description BLOOD  HAND LEFT   Final    Special Requests     Final    Value: BOTTLES DRAWN AEROBIC AND ANAEROBIC 10CC EA PATIENT ON FOLLOWING ROCEPHIN,ZITHROMAX   Culture  Setup Time 11/19/2011 21:13   Final    Culture     Final    Value:        BLOOD CULTURE RECEIVED NO GROWTH TO DATE CULTURE WILL BE HELD FOR 5 DAYS BEFORE ISSUING A FINAL NEGATIVE REPORT   Report Status PENDING   Incomplete   MRSA PCR SCREENING     Status: Normal   Collection Time   11/19/11  6:11 PM      Component Value Range Status Comment   MRSA by PCR NEGATIVE  NEGATIVE Final   CULTURE, EXPECTORATED SPUTUM-ASSESSMENT     Status: Normal   Collection Time   11/20/11  8:00 AM      Component Value Range Status Comment   Specimen Description SPUTUM   Final    Special Requests Normal   Final    Sputum evaluation     Final    Value: MICROSCOPIC FINDINGS SUGGEST THAT THIS SPECIMEN IS NOT REPRESENTATIVE OF LOWER RESPIRATORY SECRETIONS. PLEASE RECOLLECT.     Gram Stain Report Called to,Read Back By and Verified With: Chelsea RN 10:05 11/20/11 ([email or web address])   Report Status 11/20/2011 FINAL   Final   GRAM STAIN     Status: Normal   Collection Time   11/20/11  8:00 AM      Component Value Range Status Comment   Specimen Description SPUTUM   Final    Special Requests Normal   Final    Gram Stain     Final    Value: ABUNDANT SQUAMOUS EPITHELIAL CELLS PRESENT     MODERATE WBC PRESENT,BOTH PMN AND MONONUCLEAR     ABUNDANT GRAM POSITIVE AND GRAM NEGATIVE BACTERIA CONSISTENT WITH NORMAL OROPHARYNGEAL FLORA     NO YEAST OR FUNGAL ELEMENTS SEEN   Report Status 11/20/2011 FINAL   Final     Staci Righter, MD [facility] [phone] pager  [phone] cell 11/20/2011, 2:44 PM

## 2011-11-21 DIAGNOSIS — R0902 Hypoxemia: Secondary | ICD-10-CM

## 2011-11-21 LAB — BASIC METABOLIC PANEL
BUN: 6 mg/dL (ref 6–23)
GFR calc Af Amer: >90 mL/min (ref >90)
GFR calc non Af Amer: >90 mL/min (ref >90)
Potassium: 3.6 mEq/L (ref 3.5–5.1)

## 2011-11-21 LAB — T-HELPER CELLS (CD4) COUNT (NOT AT ARMC): CD4 T Cell Abs: 40 uL — ABNORMAL LOW (ref 400–2700)

## 2011-11-21 LAB — CBC
HCT: 29.8 % — ABNORMAL LOW (ref 36.0–46.0)
MCHC: 33.6 g/dL (ref 30.0–36.0)
Platelets: 372 10*3/uL (ref 150–400)
RDW: 15.1 % (ref 11.5–15.5)

## 2011-11-21 LAB — LEGIONELLA ANTIGEN, URINE: Legionella Antigen, Urine: NEGATIVE

## 2011-11-21 NOTE — Progress Notes (Signed)
Pt was just lying in bed when I arrived. TV was not on. She invited me to sit down and I did. We had a long visit as she talked about her diagnosis and that she was waiting on final confirmation. She cried several times as she talked about her deep concern for her children. She told me she had discussed her health condition w/mom and niece but was too concerned to tell her children. She admitted that she is putting on a front for the most part, but felt she could be real with me and discuss how she feels. She cried several times, and also talked strongly about her faith. Her son and his friend arrived during our visit. I left so they could visit and told her I would check on her when I return.  Marjory Lies Chaplain

## 2011-11-21 NOTE — Progress Notes (Signed)
[facility]  Date of Admission:  11/19/2011  Antibiotics: Bactrim/solumedrol 10/11 -  Azith/ceftriaxone 10/9 - 10/11  Subjective: Feels better, breathing better though SOB with movement  Objective: Temp:  [97.6 F (36.4 C)-99.9 F (37.7 C)] 98.2 F (36.8 C) (10/11 1154) Pulse Rate:  [67-111] 89  (10/11 1154) Resp:  [21-28] 21  (10/11 1154) BP: (113-143)/(71-83) 129/79 mmHg (10/11 1154) SpO2:  [91 %-99 %] 99 % (10/11 1154)  General: Awake, alert, nad Skin: no rashes Lungs: diffuse rhonchi but no wheezes, RR about 20 Cor: RRR Abdomen: Soft, ntnd   Lab Results Lab Results  Component Value Date   WBC 8.2 11/21/2011   HGB 10.0* 11/21/2011   HCT 29.8* 11/21/2011   MCV 66.7* 11/21/2011   PLT 372 11/21/2011    Lab Results  Component Value Date   CREATININE 0.62 11/21/2011   BUN 6 11/21/2011   NA 135 11/21/2011   K 3.6 11/21/2011   CL 100 11/21/2011   CO2 22 11/21/2011    Lab Results  Component Value Date   ALT 8 11/19/2011   AST 35 11/19/2011   ALKPHOS 70 11/19/2011   BILITOT 0.4 11/19/2011      Microbiology: Recent Results (from the past 240 hour(s))  CULTURE, BLOOD (ROUTINE X 2)     Status: Normal (Preliminary result)   Collection Time   11/19/11  5:04 PM      Component Value Range Status Comment   Specimen Description BLOOD ARM LEFT   Final    Special Requests     Final    Value: BOTTLES DRAWN AEROBIC AND ANAEROBIC 10CC EA PATIENT ON FOLLOWING ROCEPHIN,ZITHROMAX   Culture  Setup Time 11/19/2011 21:17   Final    Culture     Final    Value:        BLOOD CULTURE RECEIVED NO GROWTH TO DATE CULTURE WILL BE HELD FOR 5 DAYS BEFORE ISSUING A FINAL NEGATIVE REPORT   Report Status PENDING   Incomplete   CULTURE, BLOOD (ROUTINE X 2)     Status: Normal (Preliminary result)   Collection Time   11/19/11  5:13 PM      Component Value Range Status Comment   Specimen Description BLOOD HAND LEFT   Final    Special Requests     Final    Value:  BOTTLES DRAWN AEROBIC AND ANAEROBIC 10CC EA PATIENT ON FOLLOWING ROCEPHIN,ZITHROMAX   Culture  Setup Time 11/19/2011 21:13   Final    Culture     Final    Value:        BLOOD CULTURE RECEIVED NO GROWTH TO DATE CULTURE WILL BE HELD FOR 5 DAYS BEFORE ISSUING A FINAL NEGATIVE REPORT   Report Status PENDING   Incomplete   MRSA PCR SCREENING     Status: Normal   Collection Time   11/19/11  6:11 PM      Component Value Range Status Comment   MRSA by PCR NEGATIVE  NEGATIVE Final   CULTURE, EXPECTORATED SPUTUM-ASSESSMENT     Status: Normal   Collection Time   11/20/11  8:00 AM      Component Value Range Status Comment   Specimen Description SPUTUM   Final    Special Requests Normal   Final    Sputum evaluation     Final    Value: MICROSCOPIC FINDINGS SUGGEST THAT THIS SPECIMEN IS NOT REPRESENTATIVE OF LOWER RESPIRATORY SECRETIONS. PLEASE RECOLLECT.     Gram Stain Report  Called to,Read Back By and Verified With: Chelsea RN 10:05 11/20/11 ([email or web address])   Report Status 11/20/2011 FINAL   Final   GRAM STAIN     Status: Normal   Collection Time   11/20/11  8:00 AM      Component Value Range Status Comment   Specimen Description SPUTUM   Final    Special Requests Normal   Final    Gram Stain     Final    Value: ABUNDANT SQUAMOUS EPITHELIAL CELLS PRESENT     MODERATE WBC PRESENT,BOTH PMN AND MONONUCLEAR     ABUNDANT GRAM POSITIVE AND GRAM NEGATIVE BACTERIA CONSISTENT WITH NORMAL OROPHARYNGEAL FLORA     NO YEAST OR FUNGAL ELEMENTS SEEN   Report Status 11/20/2011 FINAL   Final   PNEUMOCYSTIS JIROVECI SMEAR BY DFA     Status: Normal   Collection Time   11/20/11  8:00 AM      Component Value Range Status Comment   Specimen Source-PJSRC SPUTUM   Final    Pneumocystis jiroveci Ag POSITIVE   Final     Studies/Results: No results found.  Assessment/Plan: 1) PCP pneumonia - CD 4 of 40, HIV Ab positive (Western blot pending), PCP in sputum positive.  This is consistent with PCP pneumonia in a patient  with HIV/AIDS.  She continues to improve but I would continue to monitor her over the next 24 hours in ICU to assure she continues to improve.  I will d/c azithromycin and ceftriaxone.  2)HIV/AIDS - I have discussed with her treatment options pending confirmation and she is motivated for treatment.  She understands the need to take medications daily.  I also discussed that she will need prophylaxis.  All questions answered though she understands it is not officially confirmed.   -will consider starting when confirmed.  HIV RNA and genotype pending. -will check baseline labs (hepatitis, AFB blood cultures, toxo) -Mother and niece are aware of probable HIV infection, her sister does not know.  Staci Righter, MD [facility] [address] [phone] pager   11/21/2011, 3:35 PM

## 2011-11-21 NOTE — Progress Notes (Signed)
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 She presented to the emergency department with 3 weeks of dyspnea as well as cough with frothy sputum production. The patient has previously been treated by her primary care physician twice for apparent bacterial pneumonia as seen no improvement in her symptoms. Because she continued with fevers and chills and was experiencing severe shortness of breath she presented to the emergency department. Chest x-ray in the ER revealed severe bilateral pneumonia and patient was hypoxic and had significant dyspnea on exertion with minimal movement such as repositioning herself in the bed. Because of her symptomatology she was admitted to the step down unit for further monitoring and treatment  Assessment/Plan: Principal Problem:  *Acute respiratory failure with hypoxia 2/2 Severe Bilateral pneumonia due to PCP *Initially started on broad-spectrum antibiotics to treat recalcitrant community acquired pneumonia *Sputum for PCP was positive *Started on IV Solu-Medrol and given orders for pharmacy to dose Bactrim  Active Problems:  042 Ab positive-confirmatory pending *We have ordered a Western blot for confirmatory test but given low CD4 count and positive diagnosis of PCP likely initial test is accurate *CD4 count extremely low at 40-will check viral load *Defer testing for other possible infections related to immunocompromised state to infectious disease service   Hyponatremia/Dehydration *Continue IV fluid at 50 cc per hour *Follow electrolytes   Anxiety/Depression *Related to devastating new diagnosis *Patient did eventually speak with the chaplain service and has told her mother and her niece   Leukocytosis/fever *Related to acute infection as well as dehydration and  stress   Anemia *Hemoglobin stable at 9.6 after hydration   DVT prophylaxis: Lovenox Code Status: Full Family Communication: Spoke directly to patient. She informs [place] that she has told her mother and her niece of her positive status but at this point is not ready to tell her children or other friends and relatives. Disposition Plan: Remain in step down  Consultants: Infectious disease  Procedures: None  Antibiotics: Zithromax 10/9 >>> Rocephin 10/9 >>> Vancomycin 10/9 >>> 10/10 Bactrim IV 10/10 >>>  HPI/Subjective: Patient is alert and endorses finally slept last night and breathing may be slightly better. Emotionally labile at times is expected especially when discussing her current new diagnoses. We encouraged her regarding multiple new treatment options for her condition better even helpful in settings when patient presents with severely low CD4 counts. Patient reiterated her strong faith as an impetus to aid in her treatment plan and prognosis.   Objective: Blood pressure 129/79, pulse 89, temperature 98.2 F (36.8 C), temperature source Oral, resp. rate 21, height 5\' 2"  (1.575 m), weight 93.5 kg (206 lb 2.1 oz), last menstrual period 11/02/2011, SpO2 99.00%.  Intake/Output Summary (Last 24 hours) at 11/21/11 1403 Last data filed at 11/21/11 1300  Gross per 24 hour  Intake   2660 ml  Output   3300 ml  Net   -640 ml     Exam: General: No acute respiratory distress Lungs: Coarse with bilateral crackles and labored respiratory effort after minimal activity, occasional slightly productive cough, nasal cannula oxygen 4 L per minute with 97% that she rations Cardiovascular: Regular rate and rhythm without murmur gallop or rub normal S1 and S2, IV fluid at 50 cc per hour Abdomen: Nontender, nondistended, soft, bowel sounds positive, no rebound, no ascites, no appreciable mass Musculoskeletal: No significant  cyanosis, clubbing of bilateral lower extremities Neurological:  Patient is alert and oriented x3 moves all extremities x4 without any focal neurological deficits appreciated  Data Reviewed: Basic Metabolic Panel:  Lab 11/21/11 8119 11/20/11 0450 11/19/11 1248  NA 135 133* 132*  K 3.6 3.6 3.8  CL 100 101 96  CO2 22 21 24   GLUCOSE 199* 109* 112*  BUN 6 7 10   CREATININE 0.62 0.66 0.73  CALCIUM 9.0 8.7 9.3  MG -- -- --  PHOS -- -- --   Liver Function Tests:  Lab 11/19/11 1248  AST 35  ALT 8  ALKPHOS 70  BILITOT 0.4  PROT 8.6*  ALBUMIN 3.0*   No results found for this basename: LIPASE:5,AMYLASE:5 in the last 168 hours No results found for this basename: AMMONIA:5 in the last 168 hours CBC:  Lab 11/21/11 0754 11/20/11 0450 11/19/11 1248  WBC 8.2 10.8* 13.3*  NEUTROABS -- -- 11.3*  HGB 10.0* 9.6* 11.2*  HCT 29.8* 29.9* 33.6*  MCV 66.7* 67.6* 67.5*  PLT 372 413* 475*   Cardiac Enzymes: No results found for this basename: CKTOTAL:5,CKMB:5,CKMBINDEX:5,TROPONINI:5 in the last 168 hours BNP (last 3 results) No results found for this basename: PROBNP:3 in the last 8760 hours CBG: No results found for this basename: GLUCAP:5 in the last 168 hours  Recent Results (from the past 240 hour(s))  CULTURE, BLOOD (ROUTINE X 2)     Status: Normal (Preliminary result)   Collection Time   11/19/11  5:04 PM      Component Value Range Status Comment   Specimen Description BLOOD ARM LEFT   Final    Special Requests     Final    Value: BOTTLES DRAWN AEROBIC AND ANAEROBIC 10CC EA PATIENT ON FOLLOWING ROCEPHIN,ZITHROMAX   Culture  Setup Time 11/19/2011 21:17   Final    Culture     Final    Value:        BLOOD CULTURE RECEIVED NO GROWTH TO DATE CULTURE WILL BE HELD FOR 5 DAYS BEFORE ISSUING A FINAL NEGATIVE REPORT   Report Status PENDING   Incomplete   CULTURE, BLOOD (ROUTINE X 2)     Status: Normal (Preliminary result)   Collection Time   11/19/11  5:13 PM      Component Value Range Status Comment   Specimen Description BLOOD HAND LEFT   Final     Special Requests     Final    Value: BOTTLES DRAWN AEROBIC AND ANAEROBIC 10CC EA PATIENT ON FOLLOWING ROCEPHIN,ZITHROMAX   Culture  Setup Time 11/19/2011 21:13   Final    Culture     Final    Value:        BLOOD CULTURE RECEIVED NO GROWTH TO DATE CULTURE WILL BE HELD FOR 5 DAYS BEFORE ISSUING A FINAL NEGATIVE REPORT   Report Status PENDING   Incomplete   MRSA PCR SCREENING     Status: Normal   Collection Time   11/19/11  6:11 PM      Component Value Range Status Comment   MRSA by PCR NEGATIVE  NEGATIVE Final   CULTURE, EXPECTORATED SPUTUM-ASSESSMENT     Status: Normal   Collection Time   11/20/11  8:00 AM      Component Value Range Status Comment   Specimen Description SPUTUM   Final    Special Requests Normal   Final    Sputum evaluation     Final    Value: MICROSCOPIC FINDINGS SUGGEST THAT THIS SPECIMEN IS  NOT REPRESENTATIVE OF LOWER RESPIRATORY SECRETIONS. PLEASE RECOLLECT.     Gram Stain Report Called to,Read Back By and Verified With: Chelsea RN 10:05 11/20/11 ([email or web address])   Report Status 11/20/2011 FINAL   Final   GRAM STAIN     Status: Normal   Collection Time   11/20/11  8:00 AM      Component Value Range Status Comment   Specimen Description SPUTUM   Final    Special Requests Normal   Final    Gram Stain     Final    Value: ABUNDANT SQUAMOUS EPITHELIAL CELLS PRESENT     MODERATE WBC PRESENT,BOTH PMN AND MONONUCLEAR     ABUNDANT GRAM POSITIVE AND GRAM NEGATIVE BACTERIA CONSISTENT WITH NORMAL OROPHARYNGEAL FLORA     NO YEAST OR FUNGAL ELEMENTS SEEN   Report Status 11/20/2011 FINAL   Final   PNEUMOCYSTIS JIROVECI SMEAR BY DFA     Status: Normal   Collection Time   11/20/11  8:00 AM      Component Value Range Status Comment   Specimen Source-PJSRC SPUTUM   Final    Pneumocystis jiroveci Ag POSITIVE   Final      Studies:  Recent x-ray studies have been reviewed in detail by the Attending Physician  Scheduled Meds:  Reviewed in detail by the Attending  Physician   Junious Silk, ANP [facility] Office  [phone] Pager [phone]  On-Call/Text Page:      [email or web address]      password [email or web address]  If 7PM-7AM, please contact night-coverage [email or web address] Password [email or web address] 11/21/2011, 2:03 PM   LOS: 2 days   I have examined the patient and reviewed the chart. I have modified the above note and agree with it.   Calvert Cantor, MD [phone]

## 2011-11-21 NOTE — Progress Notes (Signed)
INITIAL ADULT NUTRITION ASSESSMENT Date: 11/21/2011   Time: 2:51 PM  Reason for Assessment: MD Consult  INTERVENTION:  Resource Breeze PO TID to maximize oral intake, each supplement provides 250 kcal and 9 grams of protein.   DOCUMENTATION CODES Per approved criteria  -Obesity Unspecified   ASSESSMENT: Female 46 y.o.  Dx: Acute respiratory failure with hypoxia; 042 Ab positive - Western blot confirmatory test pending  Hx:  Past Medical History  Diagnosis Date  . Obesity   . Hypertension   . Fibroids uterine  . Suppurative hidradenitis     w/ prior multiple abscess, MRSA +  . Sinus problem     twice yearly   . Anemia     iron deficient, since teenage years  . Nearsightedness     wears glasses  . Genital HSV   . Urinary incontinence   . Normal labor and delivery     5/92 and 8/95    Past Surgical History  Procedure Date  . Myomectomy 2005  . Bilateral tubal ligation   . Wisdom tooth extraction   . Flexible sigmoidoscopy 2005    due to abdominal pain; normal    Related Meds:  Scheduled Meds:   . azithromycin  500 mg Intravenous Q24H  . cefTRIAXone (ROCEPHIN)  IV  1 g Intravenous Q24H  . enoxaparin (LOVENOX) injection  40 mg Subcutaneous Q24H  . methylPREDNISolone (SOLU-MEDROL) injection  30 mg Intravenous Q12H  . sodium chloride  3 mL Intravenous Q12H  . sulfamethoxazole-trimethoprim  480 mg Intravenous Q6H  . DISCONTD: vancomycin  1,000 mg Intravenous Q8H   Continuous Infusions:   . sodium chloride 50 mL/hr at 11/21/11 1122   PRN Meds:.acetaminophen, acetaminophen, ALPRAZolam, levalbuterol, ondansetron (ZOFRAN) IV, ondansetron, polyethylene glycol   Ht: 5\' 2"  (157.5 cm)  Wt: 206 lb 2.1 oz (93.5 kg)  Ideal Wt: 50 kg % Ideal Wt: 187%  Usual Wt:  Wt Readings from Last 10 Encounters:  11/19/11 206 lb 2.1 oz (93.5 kg)  11/17/11 210 lb (95.255 kg)  06/10/11 214 lb (97.07 kg)  06/05/11 213 lb (96.616 kg)  05/07/11 210 lb (95.255 kg)  03/07/11  219 lb (99.338 kg)  01/31/11 216 lb (97.977 kg)  11/28/10 218 lb (98.884 kg)  11/15/10 219 lb (99.338 kg)  08/23/10 221 lb (100.245 kg)   % Usual Wt: 94%  Body mass index is 37.70 kg/(m^2). Obesity, class II  Food/Nutrition Related Hx: 6% weight loss in the past year, insignificant  Labs:  CMP     Component Value Date/Time   NA 135 11/21/2011 0754   K 3.6 11/21/2011 0754   CL 100 11/21/2011 0754   CO2 22 11/21/2011 0754   GLUCOSE 199* 11/21/2011 0754   BUN 6 11/21/2011 0754   CREATININE 0.62 11/21/2011 0754   CREATININE 0.78 11/14/2011 1137   CALCIUM 9.0 11/21/2011 0754   PROT 8.6* 11/19/2011 1248   ALBUMIN 3.0* 11/19/2011 1248   AST 35 11/19/2011 1248   ALT 8 11/19/2011 1248   ALKPHOS 70 11/19/2011 1248   BILITOT 0.4 11/19/2011 1248   GFRNONAA >90 11/21/2011 0754   GFRAA >90 11/21/2011 0754     Intake/Output Summary (Last 24 hours) at 11/21/11 1454 Last data filed at 11/21/11 1300  Gross per 24 hour  Intake   2660 ml  Output   3300 ml  Net   -640 ml    Diet Order: Regular   IVF:    sodium chloride Last Rate: 50 mL/hr at 11/21/11 1122  Estimated Nutritional Needs:   Kcal: 1800-1900 Protein: 90-100 grams Fluid: 1.8-2 liters  Patient reports intentional weight loss from 238 lb to 211 lb over the past year, but has recently lost down to 206 lb with poor intake for the past 3 weeks.  Patient c/o poor appetite and only eats bites of food at meal times.  Agreed to try [employer] supplement to maximize oral intake.  Patient is at nutrition risk given poor intake for 3 weeks, poor appetite, and weight loss.  NUTRITION DIAGNOSIS: -Inadequate oral intake (NI-2.1).  Status: Ongoing  RELATED TO: poor appetite  AS EVIDENCED BY: minimal intake of meals since admission and weight loss  MONITORING/EVALUATION(Goals): Goal:  Intake to meet >90% of estimated nutrition needs. Monitor:   PO intake, weight trend, labs.  EDUCATION NEEDS: -No education needs identified  at this time   Joaquin Courts, RD, LDN, CNSC Pager# [phone] After Hours Pager# [phone]  11/21/2011, 2:51 PM

## 2011-11-22 MED ORDER — BOOST / RESOURCE BREEZE PO LIQD
1.0000 | Freq: Three times a day (TID) | ORAL | Status: DC
  Administered 2011-11-22 – 2011-11-23 (×3): 1 via ORAL

## 2011-11-22 MED ORDER — SALINE SPRAY 0.65 % NA SOLN
1.0000 | NASAL | Status: DC | PRN
  Filled 2011-11-22: qty 44

## 2011-11-22 MED ORDER — ALPRAZOLAM 0.25 MG PO TABS
0.2500 mg | ORAL_TABLET | Freq: Every evening | ORAL | Status: DC | PRN
  Administered 2011-11-22 – 2011-11-23 (×3): 0.25 mg via ORAL
  Filled 2011-11-22 (×3): qty 1

## 2011-11-22 NOTE — Progress Notes (Signed)
Pt reports feeling of bladder fullness/distention.  Foley not draining.  Removed foley.  Pt voided 450 mL urine immediately.  MD on call notified- ok to leave foley out.   Will continue to monitor.    Maximino Greenland RN

## 2011-11-22 NOTE — Progress Notes (Signed)
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 She presented to the emergency department with 3 weeks of dyspnea as well as cough with frothy sputum production. The patient has previously been treated by her primary care physician twice for apparent bacterial pneumonia as seen no improvement in her symptoms. Because she continued with fevers and chills and was experiencing severe shortness of breath she presented to the emergency department. Chest x-ray in the ER revealed severe bilateral pneumonia and patient was hypoxic and had significant dyspnea on exertion with minimal movement such as repositioning herself in the bed. Because of her symptomatology she was admitted to the step down unit for further monitoring and treatment  Assessment/Plan: Principal Problem:  *Acute respiratory failure with hypoxia due to severe PCP pneumonia *Initially started on broad-spectrum antibiotics to treat recalcitrant community acquired pneumonia *Sputum for PCP was positive *Started on IV Solu-Medrol and Bactrim on 10/10 - steadily improving but still significantly dyspneic on exertion  Active Problems:  042 Ab positive-confirmatory pending *CD4 count extremely low at 40-will check viral load *ID is planning on initiating treatment prior to d/c- attempting to obtain cost free medications from drug companies- d/w Dr Drue Second.    Hyponatremia/Dehydration Improving- pt voiding well, will d/c IVF today   Anxiety/Depression *Related to devastating new diagnosis *Patient did eventually speak with the chaplain service and has told her mother and her niece   Leukocytosis/fever *Related to acute infection as well as dehydration and stress   Anemia *Hemoglobin stable at 9.6 after hydration   DVT prophylaxis: Lovenox Code Status:  Full Family Communication: Spoke directly to patient. She informs [place] that she has told her mother and her niece of her positive status but at this point is not ready to tell her children or other friends and relatives. Disposition Plan: Remain in step down  Consultants: Infectious disease  Procedures: None  Antibiotics: Zithromax 10/9 >>>10/10 Rocephin 10/9 >>>10/10 Vancomycin 10/9 >>> 10/10 Bactrim IV 10/10 >>>  HPI/Subjective: Patient is alert and endorses finally slept last night and breathing may be slightly better. Emotionally labile at times is expected especially when discussing her current new diagnoses. We encouraged her regarding multiple new treatment options for her condition better even helpful in settings when patient presents with severely low CD4 counts. Patient reiterated her strong faith as an impetus to aid in her treatment plan and prognosis.   Objective: Blood pressure 136/75, pulse 94, temperature 98.6 F (37 C), temperature source Oral, resp. rate 25, height 5\' 2"  (1.575 m), weight 93.5 kg (206 lb 2.1 oz), last menstrual period 11/02/2011, SpO2 97.00%.  Intake/Output Summary (Last 24 hours) at 11/22/11 2146 Last data filed at 11/22/11 2144  Gross per 24 hour  Intake   1550 ml  Output   2450 ml  Net   -900 ml     Exam: General: No acute respiratory distress Lungs: Coarse with bilateral crackles and labored respiratory effort after minimal activity, occasional slightly productive cough, nasal cannula oxygen 4 L per minute with 97% that she rations Cardiovascular: Regular rate and rhythm without murmur gallop or rub normal S1 and S2, IV fluid at 50 cc per hour Abdomen: Nontender, nondistended, soft, bowel sounds positive, no rebound, no ascites, no appreciable mass Musculoskeletal: No significant cyanosis, clubbing of bilateral lower extremities Neurological: Patient is alert and oriented x3 moves all  extremities x4 without any focal neurological deficits  appreciated  Data Reviewed: Basic Metabolic Panel:  Lab 11/21/11 1610 11/20/11 0450 11/19/11 1248  NA 135 133* 132*  K 3.6 3.6 3.8  CL 100 101 96  CO2 22 21 24   GLUCOSE 199* 109* 112*  BUN 6 7 10   CREATININE 0.62 0.66 0.73  CALCIUM 9.0 8.7 9.3  MG -- -- --  PHOS -- -- --   Liver Function Tests:  Lab 11/19/11 1248  AST 35  ALT 8  ALKPHOS 70  BILITOT 0.4  PROT 8.6*  ALBUMIN 3.0*   No results found for this basename: LIPASE:5,AMYLASE:5 in the last 168 hours No results found for this basename: AMMONIA:5 in the last 168 hours CBC:  Lab 11/21/11 0754 11/20/11 0450 11/19/11 1248  WBC 8.2 10.8* 13.3*  NEUTROABS -- -- 11.3*  HGB 10.0* 9.6* 11.2*  HCT 29.8* 29.9* 33.6*  MCV 66.7* 67.6* 67.5*  PLT 372 413* 475*   Cardiac Enzymes: No results found for this basename: CKTOTAL:5,CKMB:5,CKMBINDEX:5,TROPONINI:5 in the last 168 hours BNP (last 3 results) No results found for this basename: PROBNP:3 in the last 8760 hours CBG: No results found for this basename: GLUCAP:5 in the last 168 hours  Recent Results (from the past 240 hour(s))  CULTURE, BLOOD (ROUTINE X 2)     Status: Normal (Preliminary result)   Collection Time   11/19/11  5:04 PM      Component Value Range Status Comment   Specimen Description BLOOD ARM LEFT   Final    Special Requests     Final    Value: BOTTLES DRAWN AEROBIC AND ANAEROBIC 10CC EA PATIENT ON FOLLOWING ROCEPHIN,ZITHROMAX   Culture  Setup Time 11/19/2011 21:17   Final    Culture     Final    Value:        BLOOD CULTURE RECEIVED NO GROWTH TO DATE CULTURE WILL BE HELD FOR 5 DAYS BEFORE ISSUING A FINAL NEGATIVE REPORT   Report Status PENDING   Incomplete   CULTURE, BLOOD (ROUTINE X 2)     Status: Normal (Preliminary result)   Collection Time   11/19/11  5:13 PM      Component Value Range Status Comment   Specimen Description BLOOD HAND LEFT   Final    Special Requests     Final    Value: BOTTLES DRAWN AEROBIC AND ANAEROBIC 10CC EA PATIENT ON  FOLLOWING ROCEPHIN,ZITHROMAX   Culture  Setup Time 11/19/2011 21:13   Final    Culture     Final    Value:        BLOOD CULTURE RECEIVED NO GROWTH TO DATE CULTURE WILL BE HELD FOR 5 DAYS BEFORE ISSUING A FINAL NEGATIVE REPORT   Report Status PENDING   Incomplete   MRSA PCR SCREENING     Status: Normal   Collection Time   11/19/11  6:11 PM      Component Value Range Status Comment   MRSA by PCR NEGATIVE  NEGATIVE Final   CULTURE, EXPECTORATED SPUTUM-ASSESSMENT     Status: Normal   Collection Time   11/20/11  8:00 AM      Component Value Range Status Comment   Specimen Description SPUTUM   Final    Special Requests Normal   Final    Sputum evaluation     Final    Value: MICROSCOPIC FINDINGS SUGGEST THAT THIS SPECIMEN IS NOT REPRESENTATIVE OF LOWER RESPIRATORY SECRETIONS. PLEASE RECOLLECT.     Gram Stain Report  Called to,Read Back By and Verified With: Chelsea RN 10:05 11/20/11 ([email or web address])   Report Status 11/20/2011 FINAL   Final   GRAM STAIN     Status: Normal   Collection Time   11/20/11  8:00 AM      Component Value Range Status Comment   Specimen Description SPUTUM   Final    Special Requests Normal   Final    Gram Stain     Final    Value: ABUNDANT SQUAMOUS EPITHELIAL CELLS PRESENT     MODERATE WBC PRESENT,BOTH PMN AND MONONUCLEAR     ABUNDANT GRAM POSITIVE AND GRAM NEGATIVE BACTERIA CONSISTENT WITH NORMAL OROPHARYNGEAL FLORA     NO YEAST OR FUNGAL ELEMENTS SEEN   Report Status 11/20/2011 FINAL   Final   PNEUMOCYSTIS JIROVECI SMEAR BY DFA     Status: Normal   Collection Time   11/20/11  8:00 AM      Component Value Range Status Comment   Specimen Source-PJSRC SPUTUM   Final    Pneumocystis jiroveci Ag POSITIVE   Final   AFB CULTURE, BLOOD     Status: Normal (Preliminary result)   Collection Time   11/22/11  6:05 AM      Component Value Range Status Comment   Specimen Description BLOOD LEFT ARM   Final    Special Requests 5CC   Final    Culture     Final    Value: CULTURE  WILL BE EXAMINED FOR 6 WEEKS BEFORE ISSUING A FINAL REPORT   Report Status PENDING   Incomplete      Studies:  Recent x-ray studies have been reviewed in detail by the Attending Physician  Scheduled Meds:  Reviewed in detail by the Attending Physician     If 7PM-7AM, please contact night-coverage [email or web address] Password [email or web address] 11/22/2011, 9:46 PM   LOS: 3 days   Calvert Cantor, MD [phone]

## 2011-11-22 NOTE — Progress Notes (Signed)
[facility]  Date of Admission:  11/19/2011  Antibiotics: Bactrim/solumedrol 10/11 -  Azith/ceftriaxone 10/9 - 10/11  Subjective: Afebrile. doing better, breathing better though SOB with movement. Has only gotten up to bedside commode not really ambulating in room.  She has numerous questions regarding her confidentiality within the hospital regarding her diagnosis. She is concern that others will find out since she has several folks from her church congregation working at [employer]   Objective: Temp:  [97.8 F (36.6 C)-98.5 F (36.9 C)] 97.8 F (36.6 C) (10/12 0700) Pulse Rate:  [77-96] 77  (10/12 0800) Resp:  [13-25] 25  (10/12 0800) BP: (129-148)/(71-89) 135/71 mmHg (10/12 0427) SpO2:  [96 %-99 %] 96 % (10/12 0800)  General: Awake, alert, nad Lungs: diffuse rhonchi but no wheezes, RR about 20 Cor: RRR, no g/m/r/ Abdomen: Soft, ntnd, BS+ Ext = no c/c/e Skin = no rash   Lab Results Lab Results  Component Value Date   WBC 8.2 11/21/2011   HGB 10.0* 11/21/2011   HCT 29.8* 11/21/2011   MCV 66.7* 11/21/2011   PLT 372 11/21/2011    Lab Results  Component Value Date   CREATININE 0.62 11/21/2011   BUN 6 11/21/2011   NA 135 11/21/2011   K 3.6 11/21/2011   CL 100 11/21/2011   CO2 22 11/21/2011    Lab Results  Component Value Date   ALT 8 11/19/2011   AST 35 11/19/2011   ALKPHOS 70 11/19/2011   BILITOT 0.4 11/19/2011      Microbiology: Recent Results (from the past 240 hour(s))  CULTURE, BLOOD (ROUTINE X 2)     Status: Normal (Preliminary result)   Collection Time   11/19/11  5:04 PM      Component Value Range Status Comment   Specimen Description BLOOD ARM LEFT   Final    Special Requests     Final    Value: BOTTLES DRAWN AEROBIC AND ANAEROBIC 10CC EA PATIENT ON FOLLOWING ROCEPHIN,ZITHROMAX   Culture  Setup Time 11/19/2011 21:17   Final    Culture     Final    Value:        BLOOD CULTURE RECEIVED NO GROWTH TO DATE CULTURE WILL BE HELD  FOR 5 DAYS BEFORE ISSUING A FINAL NEGATIVE REPORT   Report Status PENDING   Incomplete   CULTURE, BLOOD (ROUTINE X 2)     Status: Normal (Preliminary result)   Collection Time   11/19/11  5:13 PM      Component Value Range Status Comment   Specimen Description BLOOD HAND LEFT   Final    Special Requests     Final    Value: BOTTLES DRAWN AEROBIC AND ANAEROBIC 10CC EA PATIENT ON FOLLOWING ROCEPHIN,ZITHROMAX   Culture  Setup Time 11/19/2011 21:13   Final    Culture     Final    Value:        BLOOD CULTURE RECEIVED NO GROWTH TO DATE CULTURE WILL BE HELD FOR 5 DAYS BEFORE ISSUING A FINAL NEGATIVE REPORT   Report Status PENDING   Incomplete   MRSA PCR SCREENING     Status: Normal   Collection Time   11/19/11  6:11 PM      Component Value Range Status Comment   MRSA by PCR NEGATIVE  NEGATIVE Final   CULTURE, EXPECTORATED SPUTUM-ASSESSMENT     Status: Normal   Collection Time   11/20/11  8:00 AM      Component Value  Range Status Comment   Specimen Description SPUTUM   Final    Special Requests Normal   Final    Sputum evaluation     Final    Value: MICROSCOPIC FINDINGS SUGGEST THAT THIS SPECIMEN IS NOT REPRESENTATIVE OF LOWER RESPIRATORY SECRETIONS. PLEASE RECOLLECT.     Gram Stain Report Called to,Read Back By and Verified With: Chelsea RN 10:05 11/20/11 ([email or web address])   Report Status 11/20/2011 FINAL   Final   GRAM STAIN     Status: Normal   Collection Time   11/20/11  8:00 AM      Component Value Range Status Comment   Specimen Description SPUTUM   Final    Special Requests Normal   Final    Gram Stain     Final    Value: ABUNDANT SQUAMOUS EPITHELIAL CELLS PRESENT     MODERATE WBC PRESENT,BOTH PMN AND MONONUCLEAR     ABUNDANT GRAM POSITIVE AND GRAM NEGATIVE BACTERIA CONSISTENT WITH NORMAL OROPHARYNGEAL FLORA     NO YEAST OR FUNGAL ELEMENTS SEEN   Report Status 11/20/2011 FINAL   Final   PNEUMOCYSTIS JIROVECI SMEAR BY DFA     Status: Normal   Collection Time   11/20/11  8:00 AM       Component Value Range Status Comment   Specimen Source-PJSRC SPUTUM   Final    Pneumocystis jiroveci Ag POSITIVE   Final     Studies/Results: No results found.  Assessment/Plan: 1) PCP pneumonia - CD 4 of 40, HIV Ab positive (Western blot pending), PCP in sputum positive.  This is consistent with PCP pneumonia in a patient with HIV/AIDS.  Continue with bactrim and steroids.  2)HIV/AIDS - we have discussed treatment options pending confirmation and she is motivated for treatment.  She understands the need to take medications daily.  I also discussed that she will need prophylaxis.  All questions answered though she understands it is not officially confirmed.   - await WB confirmation to start HIV treatment -results for  HIV RNA and genotype pending. -will check baseline labs (hepatitis, AFB blood cultures, toxo) - will have our clinic case managers sign her up for ADAP since she has no insurance. - will see if we can get drug assistance program.  3) disclosure: Mother and niece are aware of probable HIV infection, no one else in her family.   4) confidentiality: re assured her that her treatment team would not disclose any of her health information to anyone other than her. Also discussed that it is a serious offense amongst hospital employee if someone looks into her chart who is not providing direct care for her.  5) OI proph = start azithromycin 1200mg  Sabas Sous, MD [facility] [phone] pager   11/22/2011, 10:42 AM

## 2011-11-23 LAB — HEPATITIS B SURFACE ANTIGEN: Hepatitis B Surface Ag: NEGATIVE

## 2011-11-23 LAB — HEPATITIS C ANTIBODY: HCV Ab: NEGATIVE

## 2011-11-23 LAB — HEPATITIS B CORE ANTIBODY, TOTAL: Hep B Core Total Ab: NEGATIVE

## 2011-11-23 LAB — HEPATITIS A ANTIBODY, TOTAL: Hep A Total Ab: NEGATIVE

## 2011-11-23 MED ORDER — BISOPROLOL-HYDROCHLOROTHIAZIDE 10-6.25 MG PO TABS
1.0000 | ORAL_TABLET | Freq: Every day | ORAL | Status: DC
  Administered 2011-11-23 – 2011-11-25 (×3): 1 via ORAL
  Filled 2011-11-23 (×3): qty 1

## 2011-11-23 MED ORDER — SODIUM CHLORIDE 0.9 % IJ SOLN
INTRAMUSCULAR | Status: AC
  Filled 2011-11-23: qty 10

## 2011-11-23 NOTE — Progress Notes (Signed)
[facility]  Date of Admission:  11/19/2011  Antibiotics: Bactrim/solumedrol 10/11 -  Azith/ceftriaxone 10/9 - 10/11  Subjective: Remains Afebrile, but is receiving 2 doses of tylenol throughout the day. doing better, breathing better though SOB with movement. Has only gotten up to bedside commode not really ambulating in room.   Objective: Temp:  [97.8 F (36.6 C)-98.6 F (37 C)] 98.2 F (36.8 C) (10/13 0306) Pulse Rate:  [75-98] 79  (10/13 0800) Resp:  [16-29] 22  (10/13 0800) BP: (136-147)/(74-92) 147/74 mmHg (10/13 0306) SpO2:  [92 %-99 %] 97 % (10/13 0800)  General: Awake, alert, nad Lungs: diffuse rhonchi but no wheezes, RR about 20 Cor: RRR, no g/m/r/ Abdomen: Soft, ntnd, BS+ Ext = no c/c/e Skin = no rash   Lab Results Lab Results  Component Value Date   WBC 8.2 11/21/2011   HGB 10.0* 11/21/2011   HCT 29.8* 11/21/2011   MCV 66.7* 11/21/2011   PLT 372 11/21/2011    Lab Results  Component Value Date   CREATININE 0.62 11/21/2011   BUN 6 11/21/2011   NA 135 11/21/2011   K 3.6 11/21/2011   CL 100 11/21/2011   CO2 22 11/21/2011    Lab Results  Component Value Date   ALT 8 11/19/2011   AST 35 11/19/2011   ALKPHOS 70 11/19/2011   BILITOT 0.4 11/19/2011      Microbiology: Recent Results (from the past 240 hour(s))  CULTURE, BLOOD (ROUTINE X 2)     Status: Normal (Preliminary result)   Collection Time   11/19/11  5:04 PM      Component Value Range Status Comment   Specimen Description BLOOD ARM LEFT   Final    Special Requests     Final    Value: BOTTLES DRAWN AEROBIC AND ANAEROBIC 10CC EA PATIENT ON FOLLOWING ROCEPHIN,ZITHROMAX   Culture  Setup Time 11/19/2011 21:17   Final    Culture     Final    Value:        BLOOD CULTURE RECEIVED NO GROWTH TO DATE CULTURE WILL BE HELD FOR 5 DAYS BEFORE ISSUING A FINAL NEGATIVE REPORT   Report Status PENDING   Incomplete   CULTURE, BLOOD (ROUTINE X 2)     Status: Normal (Preliminary  result)   Collection Time   11/19/11  5:13 PM      Component Value Range Status Comment   Specimen Description BLOOD HAND LEFT   Final    Special Requests     Final    Value: BOTTLES DRAWN AEROBIC AND ANAEROBIC 10CC EA PATIENT ON FOLLOWING ROCEPHIN,ZITHROMAX   Culture  Setup Time 11/19/2011 21:13   Final    Culture     Final    Value:        BLOOD CULTURE RECEIVED NO GROWTH TO DATE CULTURE WILL BE HELD FOR 5 DAYS BEFORE ISSUING A FINAL NEGATIVE REPORT   Report Status PENDING   Incomplete   MRSA PCR SCREENING     Status: Normal   Collection Time   11/19/11  6:11 PM      Component Value Range Status Comment   MRSA by PCR NEGATIVE  NEGATIVE Final   CULTURE, EXPECTORATED SPUTUM-ASSESSMENT     Status: Normal   Collection Time   11/20/11  8:00 AM      Component Value Range Status Comment   Specimen Description SPUTUM   Final    Special Requests Normal   Final  Sputum evaluation     Final    Value: MICROSCOPIC FINDINGS SUGGEST THAT THIS SPECIMEN IS NOT REPRESENTATIVE OF LOWER RESPIRATORY SECRETIONS. PLEASE RECOLLECT.     Gram Stain Report Called to,Read Back By and Verified With: Chelsea RN 10:05 11/20/11 ([email or web address])   Report Status 11/20/2011 FINAL   Final   GRAM STAIN     Status: Normal   Collection Time   11/20/11  8:00 AM      Component Value Range Status Comment   Specimen Description SPUTUM   Final    Special Requests Normal   Final    Gram Stain     Final    Value: ABUNDANT SQUAMOUS EPITHELIAL CELLS PRESENT     MODERATE WBC PRESENT,BOTH PMN AND MONONUCLEAR     ABUNDANT GRAM POSITIVE AND GRAM NEGATIVE BACTERIA CONSISTENT WITH NORMAL OROPHARYNGEAL FLORA     NO YEAST OR FUNGAL ELEMENTS SEEN   Report Status 11/20/2011 FINAL   Final   PNEUMOCYSTIS JIROVECI SMEAR BY DFA     Status: Normal   Collection Time   11/20/11  8:00 AM      Component Value Range Status Comment   Specimen Source-PJSRC SPUTUM   Final    Pneumocystis jiroveci Ag POSITIVE   Final   AFB CULTURE, BLOOD      Status: Normal (Preliminary result)   Collection Time   11/22/11  6:05 AM      Component Value Range Status Comment   Specimen Description BLOOD LEFT ARM   Final    Special Requests 5CC   Final    Culture     Final    Value: CULTURE WILL BE EXAMINED FOR 6 WEEKS BEFORE ISSUING A FINAL REPORT   Report Status PENDING   Incomplete     Studies/Results: No results found.  Assessment/Plan: 1) PCP pneumonia - CD 4 of 40, HIV Ab positive (Western blot pending), PCP in sputum positive.  This is consistent with PCP pneumonia in a patient with HIV/AIDS.    - will need a total of 21 days of treatment of bactrim (including IV doses), will convert to oral regimen tomorrow or Tuesday. After finishing treatment doses, she will need to be on prophylaxis with bactrim DS 1 tab daily.  - will need the following taper of steroids: starting on 10/14 can change the current steroid dosing to prednisone 40mg  daily x 5 days, followed by 20mg  daily x 11 days.  2)HIV/AIDS - we have discussed treatment options pending confirmation and she is motivated for treatment.  She understands the need to take medications daily.  I also discussed that she will need prophylaxis.  All questions answered though she understands it is not officially confirmed.    - still awaiting WB confirmation to start HIV treatment, as well as HIV RNA Viral Load and genotype pending. - will have our clinic case managers sign her up for ADAP since she has no insurance. - will see if we can get drug assistance program.  3) disclosure/confidentiality: Mother and niece are aware of probable HIV infection, no one else in her family. I have re assured her that her treatment team would not disclose any of her health information to anyone other than her. Also discussed that it is a serious offense amongst hospital employee if someone looks into her chart who is not providing direct care for her.  5) OI proph = start azithromycin 1200mg  Qwk  Judyann Munson, MD [facility]  [phone] pager   11/23/2011, 10:12 AM

## 2011-11-23 NOTE — Progress Notes (Signed)
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 She presented to the emergency department with 3 weeks of dyspnea as well as cough with frothy sputum production. The patient has previously been treated by her primary care physician twice for apparent bacterial pneumonia as seen no improvement in her symptoms. Because she continued with fevers and chills and was experiencing severe shortness of breath she presented to the emergency department. Chest x-ray in the ER revealed severe bilateral pneumonia and patient was hypoxic and had significant dyspnea on exertion with minimal movement such as repositioning herself in the bed. Because of her symptomatology she was admitted to the step down unit for further monitoring and treatment  Assessment/Plan: Principal Problem:  *Acute respiratory failure with hypoxia due to severe PCP pneumonia *Initially started on broad-spectrum antibiotics to treat recalcitrant community acquired pneumonia *Sputum for PCP was positive *Started on IV Solu-Medrol and Bactrim on 10/10 - steadily improving but still significantly dyspneic on exertion  Active Problems:  042 Ab positive-confirmatory pending *CD4 count extremely low at 40-will check viral load *ID is planning on initiating treatment prior to d/c- attempting to obtain cost free medications from drug companies- d/w Dr Drue Second.    Hyponatremia/Dehydration Improving- pt voiding well, will d/c IVF today   Anxiety/Depression *Related to devastating new diagnosis *Patient did eventually speak with the chaplain service and has told her mother and her niece   Leukocytosis/fever *Related to acute infection as well as dehydration and stress   Anemia *Hemoglobin stable at 9.6 after hydration   DVT prophylaxis: Lovenox Code Status:  Full Family Communication: Spoke directly to patient. She informs [place] that she has told her mother and her niece of her positive status but at this point is not ready to tell her children or other friends and relatives. Disposition Plan: Remain in step down  Consultants: Infectious disease  Procedures: None  Antibiotics: Zithromax 10/9 >>>10/10 Rocephin 10/9 >>>10/10 Vancomycin 10/9 >>> 10/10 Bactrim IV 10/10 >>>  HPI/Subjective: Patient feeling much better today- smiling. Tells me that her appetite is back and she is trying to be more mobile.    Objective: Blood pressure 160/88, pulse 86, temperature 98.1 F (36.7 C), temperature source Oral, resp. rate 23, height 5\' 2"  (1.575 m), weight 93.5 kg (206 lb 2.1 oz), last menstrual period 11/02/2011, SpO2 92.00%.  Intake/Output Summary (Last 24 hours) at 11/23/11 1816 Last data filed at 11/23/11 1400  Gross per 24 hour  Intake   2100 ml  Output   1575 ml  Net    525 ml     Exam: General: No acute respiratory distress Lungs: Coarse with bilateral crackles and labored respiratory effort after minimal activity, occasional slightly productive cough, nasal cannula oxygen 2 L - 99% Cardiovascular: Regular rate and rhythm without murmur gallop or rub normal S1 and S2 Abdomen: Nontender, nondistended, soft, bowel sounds positive, no rebound, no ascites, no appreciable mass Musculoskeletal: No significant cyanosis, clubbing of bilateral lower extremities Neurological: Patient is alert and oriented x3 moves all extremities x4 without any focal neurological deficits appreciated  Data Reviewed: Basic Metabolic Panel:  Lab 11/21/11 8119 11/20/11 0450 11/19/11 1248  NA 135 133* 132*  K 3.6 3.6 3.8  CL 100 101 96  CO2 22 21 24   GLUCOSE 199* 109* 112*  BUN 6 7 10   CREATININE 0.62 0.66 0.73  CALCIUM 9.0 8.7 9.3  MG -- -- --  PHOS -- -- --   Liver Function Tests:  Lab 11/19/11 1248  AST 35  ALT 8  ALKPHOS 70  BILITOT 0.4  PROT  8.6*  ALBUMIN 3.0*   No results found for this basename: LIPASE:5,AMYLASE:5 in the last 168 hours No results found for this basename: AMMONIA:5 in the last 168 hours CBC:  Lab 11/21/11 0754 11/20/11 0450 11/19/11 1248  WBC 8.2 10.8* 13.3*  NEUTROABS -- -- 11.3*  HGB 10.0* 9.6* 11.2*  HCT 29.8* 29.9* 33.6*  MCV 66.7* 67.6* 67.5*  PLT 372 413* 475*   Cardiac Enzymes: No results found for this basename: CKTOTAL:5,CKMB:5,CKMBINDEX:5,TROPONINI:5 in the last 168 hours BNP (last 3 results) No results found for this basename: PROBNP:3 in the last 8760 hours CBG: No results found for this basename: GLUCAP:5 in the last 168 hours  Recent Results (from the past 240 hour(s))  CULTURE, BLOOD (ROUTINE X 2)     Status: Normal (Preliminary result)   Collection Time   11/19/11  5:04 PM      Component Value Range Status Comment   Specimen Description BLOOD ARM LEFT   Final    Special Requests     Final    Value: BOTTLES DRAWN AEROBIC AND ANAEROBIC 10CC EA PATIENT ON FOLLOWING ROCEPHIN,ZITHROMAX   Culture  Setup Time 11/19/2011 21:17   Final    Culture     Final    Value:        BLOOD CULTURE RECEIVED NO GROWTH TO DATE CULTURE WILL BE HELD FOR 5 DAYS BEFORE ISSUING A FINAL NEGATIVE REPORT   Report Status PENDING   Incomplete   CULTURE, BLOOD (ROUTINE X 2)     Status: Normal (Preliminary result)   Collection Time   11/19/11  5:13 PM      Component Value Range Status Comment   Specimen Description BLOOD HAND LEFT   Final    Special Requests     Final    Value: BOTTLES DRAWN AEROBIC AND ANAEROBIC 10CC EA PATIENT ON FOLLOWING ROCEPHIN,ZITHROMAX   Culture  Setup Time 11/19/2011 21:13   Final    Culture     Final    Value:        BLOOD CULTURE RECEIVED NO GROWTH TO DATE CULTURE WILL BE HELD FOR 5 DAYS BEFORE ISSUING A FINAL NEGATIVE REPORT   Report Status PENDING   Incomplete   MRSA PCR SCREENING     Status: Normal   Collection Time   11/19/11  6:11 PM      Component Value Range Status Comment    MRSA by PCR NEGATIVE  NEGATIVE Final   CULTURE, EXPECTORATED SPUTUM-ASSESSMENT     Status: Normal   Collection Time   11/20/11  8:00 AM      Component Value Range Status Comment   Specimen Description SPUTUM   Final    Special Requests Normal   Final    Sputum evaluation     Final    Value: MICROSCOPIC FINDINGS SUGGEST THAT THIS SPECIMEN IS NOT REPRESENTATIVE OF LOWER RESPIRATORY SECRETIONS. PLEASE RECOLLECT.     Gram Stain Report Called to,Read Back By and Verified With: Chelsea RN 10:05 11/20/11 ([email or web address])   Report Status 11/20/2011 FINAL   Final   GRAM STAIN     Status: Normal   Collection Time   11/20/11  8:00 AM      Component Value Range Status Comment   Specimen Description SPUTUM  Final    Special Requests Normal   Final    Gram Stain     Final    Value: ABUNDANT SQUAMOUS EPITHELIAL CELLS PRESENT     MODERATE WBC PRESENT,BOTH PMN AND MONONUCLEAR     ABUNDANT GRAM POSITIVE AND GRAM NEGATIVE BACTERIA CONSISTENT WITH NORMAL OROPHARYNGEAL FLORA     NO YEAST OR FUNGAL ELEMENTS SEEN   Report Status 11/20/2011 FINAL   Final   PNEUMOCYSTIS JIROVECI SMEAR BY DFA     Status: Normal   Collection Time   11/20/11  8:00 AM      Component Value Range Status Comment   Specimen Source-PJSRC SPUTUM   Final    Pneumocystis jiroveci Ag POSITIVE   Final   AFB CULTURE, BLOOD     Status: Normal (Preliminary result)   Collection Time   11/22/11  6:05 AM      Component Value Range Status Comment   Specimen Description BLOOD LEFT ARM   Final    Special Requests 5CC   Final    Culture     Final    Value: CULTURE WILL BE EXAMINED FOR 6 WEEKS BEFORE ISSUING A FINAL REPORT   Report Status PENDING   Incomplete      Studies:  Recent x-ray studies have been reviewed in detail by the Attending Physician  Scheduled Meds:  Reviewed in detail by the Attending Physician     If 7PM-7AM, please contact night-coverage [email or web address] Password [email or web address] 11/23/2011, 6:16 PM   LOS: 4 days   Calvert Cantor, MD [phone]

## 2011-11-23 NOTE — Progress Notes (Signed)
Patient transferred to 5N room 4.  Report called to RN on 5N and all questions answered.  Patient transferred via wheelchair with NT and family.

## 2011-11-23 NOTE — Progress Notes (Signed)
ANTIBIOTIC CONSULT NOTE - INITIAL  Pharmacy Consult for Septra Indication: PCP pneumonia  No Known Allergies  Patient Measurements: Height: 5\' 2"  (157.5 cm) Weight: 206 lb 2.1 oz (93.5 kg) IBW/kg (Calculated) : 50.1   Vital Signs: Temp: 98.2 F (36.8 C) (10/13 0306) Temp src: Oral (10/13 0306) BP: 147/74 mmHg (10/13 0306) Pulse Rate: 79  (10/13 0800) Intake/Output from previous day: 10/12 0701 - 10/13 0700 In: 2620 [P.O.:320; I.V.:300; IV Piggyback:2000] Out: 2150 [Urine:2150] Intake/Output from this shift:    Labs:  [facility] 11/21/11 0754  WBC 8.2  HGB 10.0*  PLT 372  LABCREA --  CREATININE 0.62   Estimated Creatinine Clearance: 93.6 ml/min (by C-G formula based on Cr of 0.62). No results found for this basename: VANCOTROUGH:2,VANCOPEAK:2,VANCORANDOM:2,GENTTROUGH:2,GENTPEAK:2,GENTRANDOM:2,TOBRATROUGH:2,TOBRAPEAK:2,TOBRARND:2,AMIKACINPEAK:2,AMIKACINTROU:2,AMIKACIN:2, in the last 72 hours   Microbiology: Recent Results (from the past 720 hour(s))  CULTURE, BLOOD (ROUTINE X 2)     Status: Normal (Preliminary result)   Collection Time   11/19/11  5:04 PM      Component Value Range Status Comment   Specimen Description BLOOD ARM LEFT   Final    Special Requests     Final    Value: BOTTLES DRAWN AEROBIC AND ANAEROBIC 10CC EA PATIENT ON FOLLOWING ROCEPHIN,ZITHROMAX   Culture  Setup Time 11/19/2011 21:17   Final    Culture     Final    Value:        BLOOD CULTURE RECEIVED NO GROWTH TO DATE CULTURE WILL BE HELD FOR 5 DAYS BEFORE ISSUING A FINAL NEGATIVE REPORT   Report Status PENDING   Incomplete   CULTURE, BLOOD (ROUTINE X 2)     Status: Normal (Preliminary result)   Collection Time   11/19/11  5:13 PM      Component Value Range Status Comment   Specimen Description BLOOD HAND LEFT   Final    Special Requests     Final    Value: BOTTLES DRAWN AEROBIC AND ANAEROBIC 10CC EA PATIENT ON FOLLOWING ROCEPHIN,ZITHROMAX   Culture  Setup Time 11/19/2011 21:13   Final    Culture      Final    Value:        BLOOD CULTURE RECEIVED NO GROWTH TO DATE CULTURE WILL BE HELD FOR 5 DAYS BEFORE ISSUING A FINAL NEGATIVE REPORT   Report Status PENDING   Incomplete   MRSA PCR SCREENING     Status: Normal   Collection Time   11/19/11  6:11 PM      Component Value Range Status Comment   MRSA by PCR NEGATIVE  NEGATIVE Final   CULTURE, EXPECTORATED SPUTUM-ASSESSMENT     Status: Normal   Collection Time   11/20/11  8:00 AM      Component Value Range Status Comment   Specimen Description SPUTUM   Final    Special Requests Normal   Final    Sputum evaluation     Final    Value: MICROSCOPIC FINDINGS SUGGEST THAT THIS SPECIMEN IS NOT REPRESENTATIVE OF LOWER RESPIRATORY SECRETIONS. PLEASE RECOLLECT.     Gram Stain Report Called to,Read Back By and Verified With: Chelsea RN 10:05 11/20/11 ([email or web address])   Report Status 11/20/2011 FINAL   Final   GRAM STAIN     Status: Normal   Collection Time   11/20/11  8:00 AM      Component Value Range Status Comment   Specimen Description SPUTUM   Final    Special Requests Normal   Final    Gram Stain  Final    Value: ABUNDANT SQUAMOUS EPITHELIAL CELLS PRESENT     MODERATE WBC PRESENT,BOTH PMN AND MONONUCLEAR     ABUNDANT GRAM POSITIVE AND GRAM NEGATIVE BACTERIA CONSISTENT WITH NORMAL OROPHARYNGEAL FLORA     NO YEAST OR FUNGAL ELEMENTS SEEN   Report Status 11/20/2011 FINAL   Final   PNEUMOCYSTIS JIROVECI SMEAR BY DFA     Status: Normal   Collection Time   11/20/11  8:00 AM      Component Value Range Status Comment   Specimen Source-PJSRC SPUTUM   Final    Pneumocystis jiroveci Ag POSITIVE   Final   AFB CULTURE, BLOOD     Status: Normal (Preliminary result)   Collection Time   11/22/11  6:05 AM      Component Value Range Status Comment   Specimen Description BLOOD LEFT ARM   Final    Special Requests 5CC   Final    Culture     Final    Value: CULTURE WILL BE EXAMINED FOR 6 WEEKS BEFORE ISSUING A FINAL REPORT   Report Status PENDING    Incomplete     Medical History: Past Medical History  Diagnosis Date  . Obesity   . Hypertension   . Fibroids uterine  . Suppurative hidradenitis     w/ prior multiple abscess, MRSA +  . Sinus problem     twice yearly   . Anemia     iron deficient, since teenage years  . Nearsightedness     wears glasses  . Genital HSV   . Urinary incontinence   . Normal labor and delivery     5/92 and 8/95    Medications:  Scheduled:     . enoxaparin (LOVENOX) injection  40 mg Subcutaneous Q24H  . feeding supplement  1 Container Oral TID BM  . methylPREDNISolone (SOLU-MEDROL) injection  30 mg Intravenous Q12H  . sodium chloride  3 mL Intravenous Q12H  . sulfamethoxazole-trimethoprim  480 mg Intravenous Q6H   Infusions:     . DISCONTD: sodium chloride 50 mL/hr at 11/22/11 1100   Assessment: 46 y/o female patient admitted with SOB, on Septra day 3 for PCP pneumonia.   Plan:  Continue Septra 480mg  IV q6h (20mg /kg/day tmp). Monitor renal functions and c/s.  Wendie Simmer, PharmD, BCPS Clinical Pharmacist  Pager: [phone]

## 2011-11-24 DIAGNOSIS — B20 Human immunodeficiency virus [HIV] disease: Secondary | ICD-10-CM

## 2011-11-24 DIAGNOSIS — A419 Sepsis, unspecified organism: Secondary | ICD-10-CM

## 2011-11-24 MED ORDER — PREDNISONE 20 MG PO TABS
40.0000 mg | ORAL_TABLET | Freq: Every day | ORAL | Status: DC
  Administered 2011-11-25: 40 mg via ORAL
  Filled 2011-11-24 (×2): qty 2

## 2011-11-24 MED ORDER — AZITHROMYCIN 600 MG PO TABS: 1200.0000 mg | ORAL_TABLET | ORAL | Status: DC

## 2011-11-24 MED ORDER — ELVITEG-COBIC-EMTRICIT-TENOFDF 150-150-200-300 MG PO TABS: 1.0000 | ORAL_TABLET | Freq: Every day | ORAL | Status: DC

## 2011-11-24 MED ORDER — INFLUENZA VIRUS VACC SPLIT PF IM SUSP
0.5000 mL | INTRAMUSCULAR | Status: AC
  Administered 2011-11-25: 0.5 mL via INTRAMUSCULAR
  Filled 2011-11-24: qty 0.5

## 2011-11-24 MED ORDER — AZITHROMYCIN 600 MG PO TABS
1200.0000 mg | ORAL_TABLET | ORAL | Status: DC
  Administered 2011-11-24: 1200 mg via ORAL
  Filled 2011-11-24 (×3): qty 2

## 2011-11-24 NOTE — Progress Notes (Signed)
Pt was sitting up when I arrived and her niece was bedside. She said she would be released tomorrow and felt much better. She seemed to be in a much better spiritual presence of mind. With her permission, I had prayer w/pt and niece.  Marjory Lies Chaplain

## 2011-11-24 NOTE — Progress Notes (Signed)
[facility]  Date of Admission:  11/19/2011  Antibiotics: Bactrim/solumedrol 10/11 -  Azith/ceftriaxone 10/9 - 10/11  Subjective: Feels much better, accepting of diagnosis   Objective: Temp:  [98.1 F (36.7 C)-98.5 F (36.9 C)] 98.4 F (36.9 C) (10/14 1343) Pulse Rate:  [65-86] 71  (10/14 1343) Resp:  [18-20] 20  (10/14 1343) BP: (132-160)/(78-88) 148/84 mmHg (10/14 1343) SpO2:  [92 %-99 %] 96 % (10/14 1343)  General: Awake, alert, nad Lungs: CTA B Cor: RRR, no g/m/r/    Lab Results Lab Results  Component Value Date   WBC 8.2 11/21/2011   HGB 10.0* 11/21/2011   HCT 29.8* 11/21/2011   MCV 66.7* 11/21/2011   PLT 372 11/21/2011    Lab Results  Component Value Date   CREATININE 0.62 11/21/2011   BUN 6 11/21/2011   NA 135 11/21/2011   K 3.6 11/21/2011   CL 100 11/21/2011   CO2 22 11/21/2011    Lab Results  Component Value Date   ALT 8 11/19/2011   AST 35 11/19/2011   ALKPHOS 70 11/19/2011   BILITOT 0.4 11/19/2011      Microbiology: Recent Results (from the past 240 hour(s))  CULTURE, BLOOD (ROUTINE X 2)     Status: Normal (Preliminary result)   Collection Time   11/19/11  5:04 PM      Component Value Range Status Comment   Specimen Description BLOOD ARM LEFT   Final    Special Requests     Final    Value: BOTTLES DRAWN AEROBIC AND ANAEROBIC 10CC EA PATIENT ON FOLLOWING ROCEPHIN,ZITHROMAX   Culture  Setup Time 11/19/2011 21:17   Final    Culture     Final    Value:        BLOOD CULTURE RECEIVED NO GROWTH TO DATE CULTURE WILL BE HELD FOR 5 DAYS BEFORE ISSUING A FINAL NEGATIVE REPORT   Report Status PENDING   Incomplete   CULTURE, BLOOD (ROUTINE X 2)     Status: Normal (Preliminary result)   Collection Time   11/19/11  5:13 PM      Component Value Range Status Comment   Specimen Description BLOOD HAND LEFT   Final    Special Requests     Final    Value: BOTTLES DRAWN AEROBIC AND ANAEROBIC 10CC EA PATIENT ON FOLLOWING  ROCEPHIN,ZITHROMAX   Culture  Setup Time 11/19/2011 21:13   Final    Culture     Final    Value:        BLOOD CULTURE RECEIVED NO GROWTH TO DATE CULTURE WILL BE HELD FOR 5 DAYS BEFORE ISSUING A FINAL NEGATIVE REPORT   Report Status PENDING   Incomplete   MRSA PCR SCREENING     Status: Normal   Collection Time   11/19/11  6:11 PM      Component Value Range Status Comment   MRSA by PCR NEGATIVE  NEGATIVE Final   CULTURE, EXPECTORATED SPUTUM-ASSESSMENT     Status: Normal   Collection Time   11/20/11  8:00 AM      Component Value Range Status Comment   Specimen Description SPUTUM   Final    Special Requests Normal   Final    Sputum evaluation     Final    Value: MICROSCOPIC FINDINGS SUGGEST THAT THIS SPECIMEN IS NOT REPRESENTATIVE OF LOWER RESPIRATORY SECRETIONS. PLEASE RECOLLECT.     Gram Stain Report Called to,Read Back By and Verified With: Chelsea RN 10:05  11/20/11 ([email or web address])   Report Status 11/20/2011 FINAL   Final   GRAM STAIN     Status: Normal   Collection Time   11/20/11  8:00 AM      Component Value Range Status Comment   Specimen Description SPUTUM   Final    Special Requests Normal   Final    Gram Stain     Final    Value: ABUNDANT SQUAMOUS EPITHELIAL CELLS PRESENT     MODERATE WBC PRESENT,BOTH PMN AND MONONUCLEAR     ABUNDANT GRAM POSITIVE AND GRAM NEGATIVE BACTERIA CONSISTENT WITH NORMAL OROPHARYNGEAL FLORA     NO YEAST OR FUNGAL ELEMENTS SEEN   Report Status 11/20/2011 FINAL   Final   PNEUMOCYSTIS JIROVECI SMEAR BY DFA     Status: Normal   Collection Time   11/20/11  8:00 AM      Component Value Range Status Comment   Specimen Source-PJSRC SPUTUM   Final    Pneumocystis jiroveci Ag POSITIVE   Final   AFB CULTURE, BLOOD     Status: Normal (Preliminary result)   Collection Time   11/22/11  6:05 AM      Component Value Range Status Comment   Specimen Description BLOOD LEFT ARM   Final    Special Requests 5CC   Final    Culture     Final    Value: CULTURE WILL BE  EXAMINED FOR 6 WEEKS BEFORE ISSUING A FINAL REPORT   Report Status PENDING   Incomplete     Studies/Results: No results found.  Assessment/Plan: 1) PCP pneumonia - CD 4 of 40, HIV Ab positive (Western blot pending), PCP in sputum positive.  This is consistent with PCP pneumonia in a patient with HIV/AIDS.    - will need a total of 21 days of treatment of bactrim (including IV doses), will convert to oral regimen tomorrow or Tuesday. After finishing treatment doses, she will need to be on prophylaxis with bactrim DS 1 tab daily.  - will need the following taper of steroids: starting on 10/14 can change the current steroid dosing to prednisone 40mg  daily x 5 days, followed by 20mg  daily x 11 days.  2)HIV/AIDS - we have discussed treatment options pending confirmation and she is motivated for treatment.  She understands the need to take medications daily.  I also discussed that she will need prophylaxis.  All questions answered though she understands it is not officially confirmed.    - still awaiting WB confirmation to start HIV treatment, as well as HIV RNA Viral Load and genotype pending. - will have our clinic case managers sign her up for ADAP since she has no insurance. - will see if we can get drug assistance program.  I discussed starting with Stribild or other once daily pill.   3) disclosure/confidentiality: Mother and niece are aware of probable HIV infection, no one else in her family. I have re assured her that her treatment team would not disclose any of her health information to anyone other than her. Also discussed that it is a serious offense amongst hospital employee if someone looks into her chart who is not providing direct care for her.  5) OI proph = on azithromycin 1200mg  Qwk  Follow up - we will have her follow up in [facility] this week if possible.  We will call her and she tells me it is ok to call and ok to leave a message, it is her cell phone that is listed here.  She  knows she will need to provide financial information.    Staci Righter, MD [facility] [phone] pager   11/24/2011, 3:38 PM

## 2011-11-24 NOTE — Progress Notes (Signed)
Utilization review completed. Brynja Marker, RN, BSN. 

## 2011-11-24 NOTE — Progress Notes (Signed)
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 She presented to the emergency department with 3 weeks of dyspnea as well as cough with frothy sputum production. The patient has previously been treated by her primary care physician twice for apparent bacterial pneumonia as seen no improvement in her symptoms. Because she continued with fevers and chills and was experiencing severe shortness of breath she presented to the emergency department. Chest x-ray in the ER revealed severe bilateral pneumonia and patient was hypoxic and had significant dyspnea on exertion with minimal movement such as repositioning herself in the bed. Because of her symptomatology she was admitted to the step down unit for further monitoring and treatment. 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   Assessment/Plan:  Acute respiratory failure with hypoxia due to severe PCP pneumonia Initially started on broad-spectrum antibiotics to treat recalcitrant community acquired pneumonia *Sputum for PCP was positive and Vanc, rocephin and zithromax were discontinued.  *Started on IV Solu-Medrol and Bactrim on 10/10 - steadily improving but still significantly dyspneic on exertion Changed to po prednisone at 40 mg daily - 10/14    042 Ab positive-confirmatory pending *CD4 count extremely low at 40- - viral load pending  *ID is planning on initiating treatment prior to d/c- attempting to obtain free medications from drug companies-    Hyponatremia/Dehydration Due to siadh from pneumonia    Anxiety/Depression Situational when she found out the new diagnosis - improved now     Anemia *Hemoglobin stable at 9.6 after hydration   DVT prophylaxis: Lovenox Code Status: Full Family Communication: Spoke directly to patient. She informs [place] that she has told her mother and her  niece of her positive status but at this point is not ready to tell her children or other friends and relatives. Disposition Plan: home 10/15  Consultants: Infectious disease  Procedures: None  Antibiotics: Zithromax 10/9 >>>10/10 Rocephin 10/9 >>>10/10 Vancomycin 10/9 >>> 10/10 Bactrim IV 10/10 >>>  HPI/Subjective: Decreased dyspnea    Objective: Blood pressure 132/78, pulse 65, temperature 98.5 F (36.9 C), temperature source Oral, resp. rate 18, height 5\' 2"  (1.575 m), weight 93.5 kg (206 lb 2.1 oz), last menstrual period 11/02/2011, SpO2 95.00%.  Intake/Output Summary (Last 24 hours) at 11/24/11 0923 Last data filed at 11/24/11 0700  Gross per 24 hour  Intake   2583 ml  Output    475 ml  Net   2108 ml     Exam: General: No acute respiratory distress Lung sno further crackles,  Cardiovascular: Regular rate and rhythm without murmur gallop or rub normal S1 and S2 Abdomen: Nontender, nondistended, soft, bowel sounds positive, no rebound, no ascites, no appreciable mass   Data Reviewed: Basic Metabolic Panel:  Lab 11/21/11 8119 11/20/11 0450 11/19/11 1248  NA 135 133* 132*  K 3.6 3.6 3.8  CL 100 101 96  CO2 22 21 24   GLUCOSE 199* 109* 112*  BUN 6 7 10   CREATININE 0.62 0.66 0.73  CALCIUM 9.0 8.7 9.3  MG -- -- --  PHOS -- -- --   Liver Function Tests:  Lab 11/19/11 1248  AST 35  ALT 8  ALKPHOS 70  BILITOT 0.4  PROT 8.6*  ALBUMIN 3.0*   No results found for this basename: LIPASE:5,AMYLASE:5 in the last 168 hours No results found for this basename: AMMONIA:5 in the last 168 hours CBC:  Lab 11/21/11 0754 11/20/11 0450 11/19/11 1248  WBC 8.2 10.8* 13.3*  NEUTROABS -- -- 11.3*  HGB 10.0* 9.6* 11.2*  HCT 29.8* 29.9* 33.6*  MCV 66.7* 67.6* 67.5*  PLT 372 413* 475*   Cardiac Enzymes: No results found for this basename: CKTOTAL:5,CKMB:5,CKMBINDEX:5,TROPONINI:5 in the last 168 hours BNP (last 3 results) No results found for this basename: PROBNP:3 in  the last 8760 hours CBG: No results found for this basename: GLUCAP:5 in the last 168 hours  Recent Results (from the past 240 hour(s))  CULTURE, BLOOD (ROUTINE X 2)     Status: Normal (Preliminary result)   Collection Time   11/19/11  5:04 PM      Component Value Range Status Comment   Specimen Description BLOOD ARM LEFT   Final    Special Requests     Final    Value: BOTTLES DRAWN AEROBIC AND ANAEROBIC 10CC EA PATIENT ON FOLLOWING ROCEPHIN,ZITHROMAX   Culture  Setup Time 11/19/2011 21:17   Final    Culture     Final    Value:        BLOOD CULTURE RECEIVED NO GROWTH TO DATE CULTURE WILL BE HELD FOR 5 DAYS BEFORE ISSUING A FINAL NEGATIVE REPORT   Report Status PENDING   Incomplete   CULTURE, BLOOD (ROUTINE X 2)     Status: Normal (Preliminary result)   Collection Time   11/19/11  5:13 PM      Component Value Range Status Comment   Specimen Description BLOOD HAND LEFT   Final    Special Requests     Final    Value: BOTTLES DRAWN AEROBIC AND ANAEROBIC 10CC EA PATIENT ON FOLLOWING ROCEPHIN,ZITHROMAX   Culture  Setup Time 11/19/2011 21:13   Final    Culture     Final    Value:        BLOOD CULTURE RECEIVED NO GROWTH TO DATE CULTURE WILL BE HELD FOR 5 DAYS BEFORE ISSUING A FINAL NEGATIVE REPORT   Report Status PENDING   Incomplete   MRSA PCR SCREENING     Status: Normal   Collection Time   11/19/11  6:11 PM      Component Value Range Status Comment   MRSA by PCR NEGATIVE  NEGATIVE Final   CULTURE, EXPECTORATED SPUTUM-ASSESSMENT     Status: Normal   Collection Time   11/20/11  8:00 AM      Component Value Range Status Comment   Specimen Description SPUTUM   Final    Special Requests Normal   Final    Sputum evaluation     Final    Value: MICROSCOPIC FINDINGS SUGGEST THAT THIS SPECIMEN IS NOT REPRESENTATIVE OF LOWER RESPIRATORY SECRETIONS. PLEASE RECOLLECT.     Gram Stain Report Called to,Read Back By and Verified With: Chelsea RN 10:05 11/20/11 ([email or web address])   Report Status 11/20/2011  FINAL   Final   GRAM STAIN     Status: Normal   Collection Time   11/20/11  8:00 AM      Component Value Range Status Comment   Specimen Description SPUTUM   Final    Special Requests Normal   Final    Gram Stain     Final    Value: ABUNDANT SQUAMOUS EPITHELIAL CELLS PRESENT     MODERATE WBC PRESENT,BOTH PMN AND MONONUCLEAR     ABUNDANT GRAM POSITIVE AND GRAM NEGATIVE BACTERIA CONSISTENT WITH NORMAL OROPHARYNGEAL FLORA     NO YEAST OR FUNGAL ELEMENTS SEEN   Report Status  11/20/2011 FINAL   Final   PNEUMOCYSTIS JIROVECI SMEAR BY DFA     Status: Normal   Collection Time   11/20/11  8:00 AM      Component Value Range Status Comment   Specimen Source-PJSRC SPUTUM   Final    Pneumocystis jiroveci Ag POSITIVE   Final   AFB CULTURE, BLOOD     Status: Normal (Preliminary result)   Collection Time   11/22/11  6:05 AM      Component Value Range Status Comment   Specimen Description BLOOD LEFT ARM   Final    Special Requests 5CC   Final    Culture     Final    Value: CULTURE WILL BE EXAMINED FOR 6 WEEKS BEFORE ISSUING A FINAL REPORT   Report Status PENDING   Incomplete      Studies:  Recent x-ray studies have been reviewed in detail by the Attending Physician  Scheduled Meds:    . azithromycin  1,200 mg Oral Weekly  . bisoprolol-hydrochlorothiazide  1 tablet Oral Daily  . enoxaparin (LOVENOX) injection  40 mg Subcutaneous Q24H  . feeding supplement  1 Container Oral TID BM  . influenza  inactive virus vaccine  0.5 mL Intramuscular Tomorrow-1000  . predniSONE  40 mg Oral Q breakfast  . sodium chloride  3 mL Intravenous Q12H  . sodium chloride      . sulfamethoxazole-trimethoprim  480 mg Intravenous Q6H  . DISCONTD: methylPREDNISolone (SOLU-MEDROL) injection  30 mg Intravenous Q12H        If 7PM-7AM, please contact night-coverage [email or web address] Password [email or web address] 11/24/2011, 9:23 AM   LOS: 5 days   Boss Danielsen [phone]

## 2011-11-25 ENCOUNTER — Encounter (HOSPITAL_COMMUNITY): Payer: Self-pay

## 2011-11-25 DIAGNOSIS — B59 Pneumocystosis: Principal | ICD-10-CM

## 2011-11-25 LAB — HIV-1 RNA ULTRAQUANT REFLEX TO GENTYP+: HIV 1 RNA Quant: 384634 copies/mL — ABNORMAL HIGH (ref <20)

## 2011-11-25 LAB — CULTURE, BLOOD (ROUTINE X 2): Culture: NO GROWTH

## 2011-11-25 MED ORDER — SULFAMETHOXAZOLE-TRIMETHOPRIM 800-160 MG PO TABS: 2.0000 | ORAL_TABLET | Freq: Three times a day (TID) | ORAL | Status: DC

## 2011-11-25 MED ORDER — PREDNISONE 20 MG PO TABS: ORAL_TABLET | ORAL | Status: DC

## 2011-11-25 NOTE — Discharge Summary (Signed)
Physician Discharge Summary  Teresa Doyle LKG:[id number] DOB: March 17, 1965 DOA: 11/19/2011  PCP: Carollee Herter, MD  Admit date: 11/19/2011 Discharge date: 11/25/2011  Recommendations for Outpatient Follow-up:  1. ID clinic for HIV care   Discharge Diagnoses:  Acute respiratory failure with hypoxia - resolved   PCP (pneumocystis carinii pneumonia)  Anemia  Essential hypertension, benign  Hyponatremia  AIDS  Dehydration - resolved   Anxiety  Leukocytosis - resolved    Discharge Condition: fair  Diet recommendation: regular  Filed Weights   11/19/11 1600 11/19/11 1810  Weight: 95.3 kg (210 lb 1.6 oz) 93.5 kg (206 lb 2.1 oz)    History of present illness:  Teresa Doyle is a 46 y.o. female with 3 weeks symptoms of dyspnea, cough with frothy sputum production . The patient has already been to her primary care physician twice for the symptoms and has been on a course of antibiotics for the past week. She continues to have fevers and chills and she was getting progressively more short of breath. She denies any chemical exposure, recent travel, tick bites.  Up to getting the symptoms she's been in perfect health according to her. Reviewing the records so looks like she has had MRSA positive adenitis and genital herpes in the past.   Hospital Course:   Acute respiratory failure with hypoxia due to severe Pneumocystis jirovecii pneumonia  Initially started on broad-spectrum antibiotics to treat recalcitrant community acquired pneumonia  *Sputum for PCP was positive and Vanc, rocephin and zithromax were discontinued.  *Started on IV Solu-Medrol and Bactrim on 10/10  - steadily improving but still significantly dyspneic on exertion  Changed to po prednisone at 40 mg daily - 10/14 . To have a prednisone taper at DC 40 2 days, 20 2 days, 10 2 days.  Patient to take Bactrim DS 2 po TID for 2 more weeks. Patient educated on side effects related to abx treatment.   042 Ab  positive-confirmatory pending  *CD4 count extremely low at 40-  - viral load pending  *ID is planning on initiating treatment - attempting to obtain free medications from drug companies-  patient to f/u at the ID clinic   Hyponatremia/Dehydration  Due to siadh from pneumonia . Resolved with iv fluids   Anxiety/Depression  Situational when she found out the new diagnosis - improved now  Anemia  *Hemoglobin stable at 9.6 after hydration   Consultations:  ID  Discharge Exam: Filed Vitals:   11/24/11 0651 11/24/11 1343 11/24/11 2130 11/25/11 0600  BP: 132/78 148/84 116/65 114/68  Pulse: 65 71 66 64  Temp: 98.5 F (36.9 C) 98.4 F (36.9 C) 98.7 F (37.1 C) 98.3 F (36.8 C)  TempSrc:      Resp: 18 20 18 20   Height:      Weight:      SpO2: 95% 96% 96% 93%    General: axox3 Cardiovascular: rrr Respiratory: ctab   Discharge Instructions  Discharge Orders    Future Orders Please Complete By Expires   Diet general      Increase activity slowly          Medication List     As of 11/25/2011 11:17 AM    STOP taking these medications         amoxicillin-clavulanate 875-125 MG per tablet   Commonly known as: AUGMENTIN      bisoprolol-hydrochlorothiazide 10-6.25 MG per tablet   Commonly known as: ZIAC      naproxen sodium 220 MG tablet  Commonly known as: ANAPROX      TAKE these medications         albuterol 108 (90 BASE) MCG/ACT inhaler   Commonly known as: PROVENTIL HFA;VENTOLIN HFA   Inhale 2 puffs into the lungs every 6 (six) hours as needed for wheezing.      azithromycin 600 MG tablet   Commonly known as: ZITHROMAX   Take 2 tablets (1,200 mg total) by mouth once a week.      predniSONE 20 MG tablet   Commonly known as: DELTASONE   Take 2 tablets/day for 2 days then 1 tablet/day for 2 days then 1/2 tablet/day for 2 days then stop      sulfamethoxazole-trimethoprim 800-160 MG per tablet   Commonly known as: BACTRIM DS,SEPTRA DS   Take 2 tablets by  mouth 3 (three) times daily.      valACYclovir 1000 MG tablet   Commonly known as: VALTREX   Take 1,000 mg by mouth 2 (two) times daily as needed. For outbreak           Follow-up Information    Schedule an appointment as soon as possible for a visit with Staci Righter, MD.   Contact information:   [address] [place] [address] [phone]           The results of significant diagnostics from this hospitalization (including imaging, microbiology, ancillary and laboratory) are listed below for reference.    Significant Diagnostic Studies: Dg Chest 2 View  11/19/2011  *RADIOLOGY REPORT*  Clinical Data: Chest pain, cough, body aches, shortness of breath and fever.  CHEST - 2 VIEW  Comparison: None.  Findings: Trachea is midline.  Heart is at the upper limits normal in size and accentuated by low lung volumes.  Mild diffuse bilateral air space disease.  No pleural fluid.  IMPRESSION: Mild diffuse bilateral air space disease is suspicious for viral or atypical pneumonia.  Edema is not excluded.   Original Report Authenticated By: Reyes Ivan, M.D.     Microbiology: Recent Results (from the past 240 hour(s))  CULTURE, BLOOD (ROUTINE X 2)     Status: Normal   Collection Time   11/19/11  5:04 PM      Component Value Range Status Comment   Specimen Description BLOOD ARM LEFT   Final    Special Requests     Final    Value: BOTTLES DRAWN AEROBIC AND ANAEROBIC 10CC EA PATIENT ON FOLLOWING ROCEPHIN,ZITHROMAX   Culture  Setup Time 11/19/2011 21:17   Final    Culture NO GROWTH 5 DAYS   Final    Report Status 11/25/2011 FINAL   Final   CULTURE, BLOOD (ROUTINE X 2)     Status: Normal   Collection Time   11/19/11  5:13 PM      Component Value Range Status Comment   Specimen Description BLOOD HAND LEFT   Final    Special Requests     Final    Value: BOTTLES DRAWN AEROBIC AND ANAEROBIC 10CC EA PATIENT ON FOLLOWING ROCEPHIN,ZITHROMAX   Culture  Setup Time 11/19/2011 21:13    Final    Culture NO GROWTH 5 DAYS   Final    Report Status 11/25/2011 FINAL   Final   MRSA PCR SCREENING     Status: Normal   Collection Time   11/19/11  6:11 PM      Component Value Range Status Comment   MRSA by PCR NEGATIVE  NEGATIVE Final   CULTURE,  EXPECTORATED SPUTUM-ASSESSMENT     Status: Normal   Collection Time   11/20/11  8:00 AM      Component Value Range Status Comment   Specimen Description SPUTUM   Final    Special Requests Normal   Final    Sputum evaluation     Final    Value: MICROSCOPIC FINDINGS SUGGEST THAT THIS SPECIMEN IS NOT REPRESENTATIVE OF LOWER RESPIRATORY SECRETIONS. PLEASE RECOLLECT.     Gram Stain Report Called to,Read Back By and Verified With: Chelsea RN 10:05 11/20/11 ([email or web address])   Report Status 11/20/2011 FINAL   Final   GRAM STAIN     Status: Normal   Collection Time   11/20/11  8:00 AM      Component Value Range Status Comment   Specimen Description SPUTUM   Final    Special Requests Normal   Final    Gram Stain     Final    Value: ABUNDANT SQUAMOUS EPITHELIAL CELLS PRESENT     MODERATE WBC PRESENT,BOTH PMN AND MONONUCLEAR     ABUNDANT GRAM POSITIVE AND GRAM NEGATIVE BACTERIA CONSISTENT WITH NORMAL OROPHARYNGEAL FLORA     NO YEAST OR FUNGAL ELEMENTS SEEN   Report Status 11/20/2011 FINAL   Final   PNEUMOCYSTIS JIROVECI SMEAR BY DFA     Status: Normal   Collection Time   11/20/11  8:00 AM      Component Value Range Status Comment   Specimen Source-PJSRC SPUTUM   Final    Pneumocystis jiroveci Ag POSITIVE   Final   AFB CULTURE, BLOOD     Status: Normal (Preliminary result)   Collection Time   11/22/11  6:05 AM      Component Value Range Status Comment   Specimen Description BLOOD LEFT ARM   Final    Special Requests 5CC   Final    Culture     Final    Value: CULTURE WILL BE EXAMINED FOR 6 WEEKS BEFORE ISSUING A FINAL REPORT   Report Status PENDING   Incomplete      Labs: Basic Metabolic Panel:  Lab 11/21/11 1610 11/20/11 0450 11/19/11  1248  NA 135 133* 132*  K 3.6 3.6 3.8  CL 100 101 96  CO2 22 21 24   GLUCOSE 199* 109* 112*  BUN 6 7 10   CREATININE 0.62 0.66 0.73  CALCIUM 9.0 8.7 9.3  MG -- -- --  PHOS -- -- --   Liver Function Tests:  Lab 11/19/11 1248  AST 35  ALT 8  ALKPHOS 70  BILITOT 0.4  PROT 8.6*  ALBUMIN 3.0*   No results found for this basename: LIPASE:5,AMYLASE:5 in the last 168 hours No results found for this basename: AMMONIA:5 in the last 168 hours CBC:  Lab 11/21/11 0754 11/20/11 0450 11/19/11 1248  WBC 8.2 10.8* 13.3*  NEUTROABS -- -- 11.3*  HGB 10.0* 9.6* 11.2*  HCT 29.8* 29.9* 33.6*  MCV 66.7* 67.6* 67.5*  PLT 372 413* 475*   Cardiac Enzymes: No results found for this basename: CKTOTAL:5,CKMB:5,CKMBINDEX:5,TROPONINI:5 in the last 168 hours BNP: BNP (last 3 results) No results found for this basename: PROBNP:3 in the last 8760 hours CBG: No results found for this basename: GLUCAP:5 in the last 168 hours  Time coordinating discharge: 45 minutes  Signed:  Kaci Freel  [facility] 11/25/2011, 11:17 AM

## 2011-11-28 ENCOUNTER — Ambulatory Visit (INDEPENDENT_AMBULATORY_CARE_PROVIDER_SITE_OTHER): Payer: Self-pay | Admitting: Infectious Disease

## 2011-11-28 ENCOUNTER — Ambulatory Visit: Payer: Self-pay

## 2011-11-28 ENCOUNTER — Encounter: Payer: Self-pay | Admitting: Infectious Disease

## 2011-11-28 VITALS — BP 124/88 | HR 116 | Temp 98.5°F | Wt 199.0 lb

## 2011-11-28 DIAGNOSIS — B20 Human immunodeficiency virus [HIV] disease: Secondary | ICD-10-CM

## 2011-11-28 DIAGNOSIS — A419 Sepsis, unspecified organism: Secondary | ICD-10-CM

## 2011-11-28 DIAGNOSIS — B59 Pneumocystosis: Secondary | ICD-10-CM

## 2011-11-28 DIAGNOSIS — Z23 Encounter for immunization: Secondary | ICD-10-CM

## 2011-11-28 MED ORDER — DARUNAVIR ETHANOLATE 800 MG PO TABS: 800.0000 mg | ORAL_TABLET | Freq: Every day | ORAL | Status: DC

## 2011-11-28 MED ORDER — EMTRICITABINE-TENOFOVIR DF 200-300 MG PO TABS: 1.0000 | ORAL_TABLET | Freq: Every day | ORAL | Status: DC

## 2011-11-28 MED ORDER — RITONAVIR 100 MG PO TABS: 100.0000 mg | ORAL_TABLET | Freq: Every day | ORAL | Status: DC

## 2011-11-28 MED ORDER — SULFAMETHOXAZOLE-TRIMETHOPRIM 800-160 MG PO TABS: 2.0000 | ORAL_TABLET | Freq: Three times a day (TID) | ORAL | Status: DC

## 2011-11-28 MED ORDER — PREDNISONE 20 MG PO TABS: ORAL_TABLET | ORAL | Status: DC

## 2011-11-28 NOTE — Progress Notes (Signed)
Subjective:    Patient ID: Teresa Doyle, female    DOB: 1965-05-02, 46 y.o.   MRN: [id number]  HPI  Teresa Doyle is a 46 year old African  American lady with newly diagnosed HIV/AIDS and PCP pneumonia recently Dc from [facility]. Her cd4 was 40, VL >300K. Genotype was not done. She is feeling better and better though she missed one day of her abx due to problem obtained the medicine. Her cough is dramatically improved and she feels much much better. She is brought paperwork for the AIDS drug assistance program application. I have samples of Prezista Norvir Truvada and will start her on 800 mg of Prezista in this case 2 x 400 mg tablets dosed with 100 g of Norvir and one tablet Truvada daily. We'll continue on this regimen and process an ADAP Application for this medicineregimen. In the interim she can consider alternative regimens that we might change her to once we haveadap approval. I reviewed all first line DHHS and alternative regimens that I feel to be appropriate for her. I spent greater than 45 minutes with the patient including greater than 50% of time in face to face counsel of the patient and in coordination of their care.   Review of Systems  Constitutional: Positive for fatigue. Negative for fever, chills, diaphoresis, activity change, appetite change and unexpected weight change.  HENT: Negative for congestion, sore throat, rhinorrhea, sneezing, trouble swallowing and sinus pressure.   Eyes: Negative for photophobia and visual disturbance.  Respiratory: Negative for cough, chest tightness, shortness of breath, wheezing and stridor.   Cardiovascular: Negative for chest pain, palpitations and leg swelling.  Gastrointestinal: Negative for nausea, vomiting, abdominal pain, diarrhea, constipation, blood in stool, abdominal distention and anal bleeding.  Genitourinary: Negative for dysuria, hematuria, flank pain and difficulty urinating.  Musculoskeletal: Negative for myalgias, back pain, joint  swelling, arthralgias and gait problem.  Skin: Negative for color change, pallor, rash and wound.  Neurological: Negative for dizziness, tremors, weakness and light-headedness.  Hematological: Negative for adenopathy. Does not bruise/bleed easily.  Psychiatric/Behavioral: Negative for behavioral problems, confusion, disturbed wake/sleep cycle, dysphoric mood, decreased concentration and agitation.       Objective:   Physical Exam  Constitutional: She is oriented to person, place, and time. She appears well-developed and well-nourished. No distress.  HENT:  Head: Normocephalic and atraumatic.  Mouth/Throat: Oropharynx is clear and moist. No oropharyngeal exudate.  Eyes: Conjunctivae normal and EOM are normal. Pupils are equal, round, and reactive to light. No scleral icterus.  Neck: Normal range of motion. Neck supple. No JVD present.  Cardiovascular: Normal rate, regular rhythm and normal heart sounds.  Exam reveals no gallop and no friction rub.   No murmur heard. Pulmonary/Chest: Effort normal and breath sounds normal. No respiratory distress. She has no wheezes. She has no rales. She exhibits no tenderness.  Abdominal: She exhibits no distension and no mass. There is no tenderness. There is no rebound and no guarding.  Musculoskeletal: She exhibits no edema and no tenderness.  Lymphadenopathy:    She has no cervical adenopathy.  Neurological: She is alert and oriented to person, place, and time. She has normal reflexes. She exhibits normal muscle tone. Coordination normal.  Skin: Skin is warm and dry. She is not diaphoretic. No erythema. No pallor.  Psychiatric: She has a normal mood and affect. Her behavior is normal. Judgment and thought content normal.          Assessment & Plan:   1 HIV and  AIDS: Will start the patient on Prezista Norvir and Truvada we'll bring back to clinic in one month's time to recheck viral load and CD4. I'm checking an HIV genotype as well as spell HLA  [id number] today. She will can do on her therapy for PCP pneumonia and then onto Bactrim one tablet daily for prophylaxis. Continue azithromycin prophylaxis.  #2 PCP pneumonia finish 21 day course of Bactrim high dose 2 tablets 3 times daily along with prednisone taper.  #3 opportunistic infection prophylaxis again after finishing course of therapy taper down to one tab tablet Bactrim daily and continue azithromycin.  #4 Healthcare maintenance: Give her vaccination for pneumonia. She did take her influenza vaccination in the hospital. She is hepatitis B immune have also offered hepatitis A vaccination.  #5 Gynecological: Patient is to have a Pap smear schedulewith our clinic.

## 2011-11-28 NOTE — Addendum Note (Signed)
Addended by: Alesia Morin F on: 11/28/2011 02:06 PM   Modules accepted: Orders

## 2011-12-03 ENCOUNTER — Telehealth: Payer: Self-pay | Admitting: *Deleted

## 2011-12-03 ENCOUNTER — Telehealth: Payer: Self-pay | Admitting: Infectious Disease

## 2011-12-03 LAB — HLA B*5701

## 2011-12-03 MED ORDER — FLUCONAZOLE 100 MG PO TABS: 100.0000 mg | ORAL_TABLET | Freq: Every day | ORAL | Status: DC

## 2011-12-03 NOTE — Telephone Encounter (Signed)
We can definitely give her fluconazole. She should take ALL of her antivirals, prezista, norvir and truvada together with some food(may help her gi symptoms)

## 2011-12-03 NOTE — Telephone Encounter (Signed)
Patient called and advised that she has started to notice small ulcers on the inside of her lips and her throat feels scratchy and full all the time. Sometimes it is hard to swallow. She advised that Dr Daiva Eves advised her this might happen and advised her to call the office if it did. She also advised she feels as if the medications are sitting in her stomach all day. She says that she is spacing them out and just wants to know if this is normal. She advised she has a full sick feeling like her stomach never empties.  Advised her will send Dr Daiva Eves this information and as soon as I hear from him will call her with his answer. She is ok with this.

## 2011-12-03 NOTE — Telephone Encounter (Signed)
Pt with possible thrush developing

## 2011-12-09 LAB — HIV-1 INTEGRASE GENOTYPE

## 2011-12-11 ENCOUNTER — Other Ambulatory Visit: Payer: Self-pay | Admitting: *Deleted

## 2011-12-11 DIAGNOSIS — B20 Human immunodeficiency virus [HIV] disease: Secondary | ICD-10-CM

## 2011-12-11 DIAGNOSIS — A6000 Herpesviral infection of urogenital system, unspecified: Secondary | ICD-10-CM

## 2011-12-11 MED ORDER — RITONAVIR 100 MG PO TABS: 100.0000 mg | ORAL_TABLET | Freq: Every day | ORAL | Status: DC

## 2011-12-11 MED ORDER — AZITHROMYCIN 600 MG PO TABS: 1200.0000 mg | ORAL_TABLET | ORAL | Status: DC

## 2011-12-11 MED ORDER — VALACYCLOVIR HCL 1 G PO TABS: 1000.0000 mg | ORAL_TABLET | Freq: Two times a day (BID) | ORAL | Status: DC | PRN

## 2011-12-11 MED ORDER — SULFAMETHOXAZOLE-TRIMETHOPRIM 800-160 MG PO TABS: 2.0000 | ORAL_TABLET | Freq: Three times a day (TID) | ORAL | Status: DC

## 2011-12-11 MED ORDER — FLUCONAZOLE 100 MG PO TABS: 100.0000 mg | ORAL_TABLET | Freq: Every day | ORAL | Status: DC

## 2011-12-11 MED ORDER — EMTRICITABINE-TENOFOVIR DF 200-300 MG PO TABS: 1.0000 | ORAL_TABLET | Freq: Every day | ORAL | Status: DC

## 2011-12-11 MED ORDER — DARUNAVIR ETHANOLATE 800 MG PO TABS: 800.0000 mg | ORAL_TABLET | Freq: Every day | ORAL | Status: DC

## 2012-01-04 LAB — AFB CULTURE, BLOOD

## 2012-01-07 ENCOUNTER — Encounter: Payer: Self-pay | Admitting: Infectious Disease

## 2012-01-07 ENCOUNTER — Other Ambulatory Visit (INDEPENDENT_AMBULATORY_CARE_PROVIDER_SITE_OTHER): Payer: Self-pay

## 2012-01-07 DIAGNOSIS — B20 Human immunodeficiency virus [HIV] disease: Secondary | ICD-10-CM

## 2012-01-07 LAB — COMPLETE METABOLIC PANEL WITH GFR
ALT: 12 U/L (ref 0–35)
AST: 22 U/L (ref 0–37)
BUN: 8 mg/dL (ref 6–23)
CO2: 23 mEq/L (ref 19–32)
Calcium: 8.9 mg/dL (ref 8.4–10.5)
Chloride: 104 mEq/L (ref 96–112)
Creat: 0.87 mg/dL (ref 0.50–1.10)
GFR, Est African American: >89 mL/min
Total Bilirubin: 0.3 mg/dL (ref 0.3–1.2)

## 2012-01-07 LAB — CBC WITH DIFFERENTIAL/PLATELET
Basophils Absolute: 0 10*3/uL (ref 0.0–0.1)
Eosinophils Absolute: 0.1 10*3/uL (ref 0.0–0.7)
Eosinophils Relative: 2 % (ref 0–5)
HCT: 32.6 % — ABNORMAL LOW (ref 36.0–46.0)
Lymphocytes Relative: 40 % (ref 12–46)
MCH: 23.4 pg — ABNORMAL LOW (ref 26.0–34.0)
MCV: 74.1 fL — ABNORMAL LOW (ref 78.0–100.0)
Monocytes Absolute: 0.6 10*3/uL (ref 0.1–1.0)
Platelets: 339 10*3/uL (ref 150–400)
RDW: 22 % — ABNORMAL HIGH (ref 11.5–15.5)

## 2012-01-07 LAB — T-HELPER CELL (CD4) - (RCID CLINIC ONLY)
CD4 % Helper T Cell: 12 % — ABNORMAL LOW (ref 33–55)
CD4 T Cell Abs: 190 uL — ABNORMAL LOW (ref 400–2700)

## 2012-01-09 LAB — HIV-1 RNA QUANT-NO REFLEX-BLD
HIV 1 RNA Quant: 2260 copies/mL — ABNORMAL HIGH (ref <20)
HIV-1 RNA Quant, Log: 3.35 {Log} — ABNORMAL HIGH (ref <1.30)

## 2012-01-12 ENCOUNTER — Telehealth: Payer: Self-pay | Admitting: *Deleted

## 2012-01-12 NOTE — Telephone Encounter (Signed)
Similar, I dont know how to read the email

## 2012-01-12 NOTE — Telephone Encounter (Signed)
Please see patient's email and advise, thanks Jacqueline Cockerham  

## 2012-01-12 NOTE — Telephone Encounter (Signed)
She may very well have a bit of anemia either from iron deficiency or anemia of chronic disease. I think we should reassess as her HIV comes under control. Thanks

## 2012-01-21 ENCOUNTER — Ambulatory Visit (INDEPENDENT_AMBULATORY_CARE_PROVIDER_SITE_OTHER): Payer: Self-pay | Admitting: Infectious Disease

## 2012-01-21 ENCOUNTER — Encounter: Payer: Self-pay | Admitting: Infectious Disease

## 2012-01-21 VITALS — BP 116/82 | HR 76 | Temp 98.2°F | Ht 62.0 in | Wt 217.0 lb

## 2012-01-21 DIAGNOSIS — B59 Pneumocystosis: Secondary | ICD-10-CM

## 2012-01-21 DIAGNOSIS — B20 Human immunodeficiency virus [HIV] disease: Secondary | ICD-10-CM

## 2012-01-21 MED ORDER — SULFAMETHOXAZOLE-TRIMETHOPRIM 800-160 MG PO TABS: 1.0000 | ORAL_TABLET | Freq: Every day | ORAL | Status: DC

## 2012-01-21 NOTE — Progress Notes (Signed)
  Subjective:    Patient ID: Teresa Doyle, female    DOB: Dec 01, 1965, 46 y.o.   MRN: [id number]  HPI  46 year old [place] American lady with newly diagnosed HIV/AIDS and PCP pneumonia recently Dc from [facility]. Her cd4 was 40, VL >300K whom I started on prezista, norvir and truvada at last visit and sent genotype which showed wild type virus. She has successfully brought her VL down into 2k range, and reconstituted CD4 to just below 200. We discussed various other ARV regimens but she wished to remain on her current regimen for now. She had some nausea and loose stools when first having started on this regimen. I spent greater than 45 minutes with the patient including greater than 50% of time in face to face counsel of the patient and in coordination of their care.    Review of Systems  Constitutional: Negative for fever, chills, diaphoresis, activity change, appetite change, fatigue and unexpected weight change.  HENT: Negative for congestion, sore throat, rhinorrhea, sneezing, trouble swallowing and sinus pressure.   Eyes: Negative for photophobia and visual disturbance.  Respiratory: Negative for cough, chest tightness, shortness of breath, wheezing and stridor.   Cardiovascular: Negative for chest pain, palpitations and leg swelling.  Gastrointestinal: Negative for nausea, vomiting, abdominal pain, diarrhea, constipation, blood in stool, abdominal distention and anal bleeding.  Genitourinary: Negative for dysuria, hematuria, flank pain and difficulty urinating.  Musculoskeletal: Negative for myalgias, back pain, joint swelling, arthralgias and gait problem.  Skin: Negative for color change, pallor, rash and wound.  Neurological: Negative for dizziness, tremors, weakness and light-headedness.  Hematological: Negative for adenopathy. Does not bruise/bleed easily.  Psychiatric/Behavioral: Negative for behavioral problems, confusion, sleep disturbance, dysphoric mood, decreased concentration and  agitation.       Objective:   Physical Exam  Constitutional: She is oriented to person, place, and time. She appears well-developed and well-nourished. No distress.  HENT:  Head: Normocephalic and atraumatic.  Mouth/Throat: Oropharynx is clear and moist. No oropharyngeal exudate.  Eyes: Conjunctivae normal and EOM are normal. Pupils are equal, round, and reactive to light. No scleral icterus.  Neck: Normal range of motion. Neck supple. No JVD present.  Cardiovascular: Normal rate, regular rhythm and normal heart sounds.  Exam reveals no gallop and no friction rub.   No murmur heard. Pulmonary/Chest: Effort normal and breath sounds normal. No respiratory distress. She has no wheezes. She has no rales. She exhibits no tenderness.  Abdominal: She exhibits no distension and no mass. There is no tenderness. There is no rebound and no guarding.  Musculoskeletal: She exhibits no edema and no tenderness.  Lymphadenopathy:    She has no cervical adenopathy.  Neurological: She is alert and oriented to person, place, and time. She has normal reflexes. She exhibits normal muscle tone. Coordination normal.  Skin: Skin is warm and dry. She is not diaphoretic. No erythema. No pallor.  Psychiatric: She has a normal mood and affect. Her behavior is normal. Judgment and thought content normal.          Assessment & Plan:   HIV: continue prezista, norvir and truvada, Consider regimens such as atripla, Stribild, complera and tivicay/truvada in future  PCP pneumonia: resolved, continue bactrim for PCP prophylaxis

## 2012-02-02 ENCOUNTER — Encounter: Payer: Self-pay | Admitting: Infectious Disease

## 2012-02-16 ENCOUNTER — Ambulatory Visit: Payer: Self-pay | Admitting: Infectious Disease

## 2012-02-16 ENCOUNTER — Telehealth: Payer: Self-pay | Admitting: *Deleted

## 2012-02-16 NOTE — Telephone Encounter (Signed)
Patient called c/o rash on torso, offered an appt and she could not come in. Advised to take benadryl for itching. She will check her work schedule and call back for an appointment. Teresa Doyle

## 2012-03-10 ENCOUNTER — Other Ambulatory Visit (INDEPENDENT_AMBULATORY_CARE_PROVIDER_SITE_OTHER): Payer: Self-pay

## 2012-03-10 DIAGNOSIS — B20 Human immunodeficiency virus [HIV] disease: Secondary | ICD-10-CM

## 2012-03-10 LAB — COMPLETE METABOLIC PANEL WITH GFR
Alkaline Phosphatase: 68 U/L (ref 39–117)
CO2: 28 mEq/L (ref 19–32)
Creat: 0.68 mg/dL (ref 0.50–1.10)
GFR, Est African American: >89 mL/min
GFR, Est Non African American: >89 mL/min
Glucose, Bld: 88 mg/dL (ref 70–99)
Sodium: 139 mEq/L (ref 135–145)
Total Bilirubin: 0.4 mg/dL (ref 0.3–1.2)
Total Protein: 6 g/dL (ref 6.0–8.3)

## 2012-03-10 LAB — CBC WITH DIFFERENTIAL/PLATELET
Basophils Absolute: 0 10*3/uL (ref 0.0–0.1)
Basophils Relative: 1 % (ref 0–1)
Lymphocytes Relative: 37 % (ref 12–46)
MCHC: 31.7 g/dL (ref 30.0–36.0)
Neutro Abs: 1.8 10*3/uL (ref 1.7–7.7)
Neutrophils Relative %: 50 % (ref 43–77)
RDW: 15.4 % (ref 11.5–15.5)
WBC: 3.5 10*3/uL — ABNORMAL LOW (ref 4.0–10.5)

## 2012-03-11 LAB — HIV-1 RNA QUANT-NO REFLEX-BLD
HIV 1 RNA Quant: 91 copies/mL — ABNORMAL HIGH (ref <20)
HIV-1 RNA Quant, Log: 1.96 {Log} — ABNORMAL HIGH (ref <1.30)

## 2012-03-11 LAB — T-HELPER CELL (CD4) - (RCID CLINIC ONLY)
CD4 % Helper T Cell: 18 % — ABNORMAL LOW (ref 33–55)
CD4 T Cell Abs: 240 uL — ABNORMAL LOW (ref 400–2700)

## 2012-03-15 ENCOUNTER — Encounter: Payer: Self-pay | Admitting: Infectious Disease

## 2012-03-24 ENCOUNTER — Ambulatory Visit: Payer: Self-pay

## 2012-03-24 ENCOUNTER — Ambulatory Visit: Payer: Self-pay | Admitting: Infectious Disease

## 2012-03-30 ENCOUNTER — Ambulatory Visit: Payer: Self-pay

## 2012-03-30 ENCOUNTER — Ambulatory Visit (INDEPENDENT_AMBULATORY_CARE_PROVIDER_SITE_OTHER): Payer: Self-pay | Admitting: Infectious Disease

## 2012-03-30 ENCOUNTER — Encounter: Payer: Self-pay | Admitting: Infectious Disease

## 2012-03-30 VITALS — BP 143/85 | HR 80 | Temp 98.1°F | Ht 62.0 in | Wt 214.0 lb

## 2012-03-30 DIAGNOSIS — R21 Rash and other nonspecific skin eruption: Secondary | ICD-10-CM

## 2012-03-30 DIAGNOSIS — B20 Human immunodeficiency virus [HIV] disease: Secondary | ICD-10-CM

## 2012-03-30 DIAGNOSIS — I10 Essential (primary) hypertension: Secondary | ICD-10-CM

## 2012-03-30 DIAGNOSIS — IMO0001 Reserved for inherently not codable concepts without codable children: Secondary | ICD-10-CM

## 2012-03-30 MED ORDER — ELVITEG-COBIC-EMTRICIT-TENOFDF 150-150-200-300 MG PO TABS: 1.0000 | ORAL_TABLET | Freq: Every day | ORAL | Status: DC

## 2012-03-30 NOTE — Progress Notes (Signed)
Subjective:    Patient ID: Teresa Doyle, female    DOB: [birth year], 47 y.o.   MRN: [id number]  HPI  46 year old African American lady with HIV/AIDS with PCP pneumonia diagnosed this fall as responded very well to Prezista Norvir Truvada with viral load has dropped to nearly 96 from the upper 100,000 and above range. Her CD4 count has climbed from 40 now to over 200. In the interim he has experienced symptoms of a rash around her neck as well as pain in her legs and thighs. I believe most of these symptoms are largely due to immune reconstitution inflammatory syndrome as it corresponded with the very nice drop in her viral load and nearly 10 fold increase in her CD4 count. Rash has now resolved. She was interested in other antiviral regimens and today we also decided to change her to [facility]. ID pharmacist Ulyses Southward was also present and counselled the pt about various SE and this is a various antiviral regimens. We spent greater  than 45 minutes with the patient including greater than 50% of time in face to face counsel of the patient and in coordination of their care.   Review of Systems  Constitutional: Negative for fever, chills, diaphoresis, activity change, appetite change, fatigue and unexpected weight change.  HENT: Negative for congestion, sore throat, rhinorrhea, sneezing, trouble swallowing and sinus pressure.   Eyes: Negative for photophobia and visual disturbance.  Respiratory: Negative for cough, chest tightness, shortness of breath, wheezing and stridor.   Cardiovascular: Negative for chest pain, palpitations and leg swelling.  Gastrointestinal: Negative for nausea, vomiting, abdominal pain, diarrhea, constipation, blood in stool, abdominal distention and anal bleeding.  Genitourinary: Negative for dysuria, hematuria, flank pain and difficulty urinating.  Musculoskeletal: Positive for arthralgias. Negative for myalgias, back pain, joint swelling and gait problem.  Skin: Negative for  color change, pallor, rash and wound.  Neurological: Negative for dizziness, tremors, weakness and light-headedness.  Hematological: Negative for adenopathy. Does not bruise/bleed easily.  Psychiatric/Behavioral: Negative for behavioral problems, confusion, sleep disturbance, dysphoric mood, decreased concentration and agitation.       Objective:   Physical Exam  Constitutional: She is oriented to person, place, and time. She appears well-developed and well-nourished. No distress.  HENT:  Head: Normocephalic and atraumatic.  Mouth/Throat: Oropharynx is clear and moist. No oropharyngeal exudate.  Eyes: Conjunctivae and EOM are normal. Pupils are equal, round, and reactive to light. No scleral icterus.  Neck: Normal range of motion. Neck supple. No JVD present.  Cardiovascular: Normal rate, regular rhythm and normal heart sounds.  Exam reveals no gallop and no friction rub.   No murmur heard. Pulmonary/Chest: Effort normal and breath sounds normal. No respiratory distress. She has no wheezes. She has no rales. She exhibits no tenderness.  Abdominal: She exhibits no distension and no mass. There is no tenderness. There is no rebound and no guarding.  Musculoskeletal: She exhibits no edema and no tenderness.  Lymphadenopathy:    She has no cervical adenopathy.  Neurological: She is alert and oriented to person, place, and time. She has normal reflexes. She exhibits normal muscle tone. Coordination normal.  Skin: Skin is warm and dry. She is not diaphoretic. No erythema. No pallor.  Psychiatric: She has a normal mood and affect. Her behavior is normal. Judgment and thought content normal.          Assessment & Plan:  HIV: Check viral load and CD4 count today per patient's request. Change to STRIBILD, bring  back in 3 months and at that time will DC her bactrim if still with high CD4 count  Myalgias: Likely due to immune reconstitution inflammatory syndrome versus possible deconditioning  and now adjustment to workload.  Rash again likely due to immune reconstitution inflammatory syndrome has resolved.  Hypertension: I counseled her to obtain a home blood pressure cuff. Previously was on antihypertensive medications but was taken off in the setting of her acute illness and PCP pneumonia.

## 2012-04-01 LAB — T-HELPER CELL (CD4) - (RCID CLINIC ONLY): CD4 T Cell Abs: 310 uL — ABNORMAL LOW (ref 400–2700)

## 2012-04-02 LAB — HIV-1 RNA QUANT-NO REFLEX-BLD: HIV-1 RNA Quant, Log: 1.76 {Log} — ABNORMAL HIGH (ref <1.30)

## 2012-04-05 NOTE — Progress Notes (Signed)
HPI: Teresa Doyle is a 47 y.o. female who is here for her HIV visit.  Allergies: No Known Allergies  Vitals:    Past Medical History: Past Medical History  Diagnosis Date  . Obesity   . Hypertension   . Fibroids uterine  . Suppurative hidradenitis     w/ prior multiple abscess, MRSA +  . Sinus problem     twice yearly   . Anemia     iron deficient, since teenage years  . Nearsightedness     wears glasses  . Genital HSV   . Urinary incontinence   . Normal labor and delivery     5/92 and 8/95    Social History: History   Social History  . Marital Status: Divorced    Spouse Name: N/A    Number of Children: 2  . 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 Smoking status: Never Smoker   . Smokeless tobacco: Never Used  . Alcohol Use: No     Comment: rare  . Drug Use: No  . Sexually Active: None   Other Topics Concern  . None   Social History Narrative   Lives with her 2 children (1 daughter, 1 son), dog; single, exercise - walking, sometimes treadmill with fitness center at work    Home Medications:  (Not in a hospital admission)  Current Regimen: ATV/r + Truvada  Labs: HIV 1 RNA Quant (copies/mL)  Date Value  03/30/2012 57*  03/10/2012 91*  01/07/2012 2260*     CD4 T Cell Abs (cmm)  Date Value  03/30/2012 310*  03/10/2012 240*  01/07/2012 190*     Hep B S Ab (no units)  Date Value  11/22/2011 POSITIVE*     Hepatitis B Surface Ag (no units)  Date Value  11/22/2011 NEGATIVE      HCV Ab (no units)  Date Value  11/22/2011 NEGATIVE     CrCl: Estimated Creatinine Clearance: 95.6 ml/min (by C-G formula based on Cr of 0.68).  Lipids:    Component Value Date/Time   CHOL 158 06/10/2011 1013   TRIG 85 06/10/2011 1013   HDL 67 06/10/2011 1013   CHOLHDL 2.4 06/10/2011 1013   VLDL 17 06/10/2011 1013   LDLCALC 74 06/10/2011 1013    Assessment: 47 yo newly dx HIV  who was recently started on ART. She has had an awesome response. Her VL is now less than 100 and CD4 is over 200. She is doing well otherwise. Her main complaint is musculoskeletal pain (all over). 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 I gave her some options of changing to once daily single tablet regimen. I explained the pros and cons of each regimen. She wanted to try Stribild due to favorable tolerability and low potential for side effects. It was also noted that her BP was elevated during this admission. I asked if she is on antihypertensive meds. She stated that she has been off of her HTN meds since discharged from the hospital because of her renal failure.   Recommendations: Change ARTs to Stribild Monitor BPs at home, may need to restart meds at next visit.  Clide Cliff, PharmD Clinical Infectious Disease Pharmacist [facility] 04/05/2012, 9:50 PM

## 2012-04-13 ENCOUNTER — Encounter: Payer: Self-pay | Admitting: Internal Medicine

## 2012-04-14 ENCOUNTER — Encounter: Payer: Self-pay | Admitting: Infectious Disease

## 2012-05-08 ENCOUNTER — Other Ambulatory Visit: Payer: Self-pay | Admitting: Family Medicine

## 2012-05-11 ENCOUNTER — Other Ambulatory Visit: Payer: Self-pay | Admitting: Family Medicine

## 2012-06-30 ENCOUNTER — Ambulatory Visit (INDEPENDENT_AMBULATORY_CARE_PROVIDER_SITE_OTHER): Payer: Self-pay | Admitting: Infectious Disease

## 2012-06-30 ENCOUNTER — Encounter: Payer: Self-pay | Admitting: Infectious Disease

## 2012-06-30 VITALS — BP 126/83 | HR 71 | Temp 98.5°F | Wt 227.0 lb

## 2012-06-30 DIAGNOSIS — B20 Human immunodeficiency virus [HIV] disease: Secondary | ICD-10-CM

## 2012-06-30 DIAGNOSIS — IMO0001 Reserved for inherently not codable concepts without codable children: Secondary | ICD-10-CM

## 2012-06-30 DIAGNOSIS — I10 Essential (primary) hypertension: Secondary | ICD-10-CM

## 2012-06-30 DIAGNOSIS — R5383 Other fatigue: Secondary | ICD-10-CM

## 2012-06-30 DIAGNOSIS — M791 Myalgia, unspecified site: Secondary | ICD-10-CM

## 2012-06-30 DIAGNOSIS — R5381 Other malaise: Secondary | ICD-10-CM

## 2012-06-30 LAB — COMPLETE METABOLIC PANEL WITH GFR
Alkaline Phosphatase: 71 U/L (ref 39–117)
BUN: 12 mg/dL (ref 6–23)
CO2: 27 mEq/L (ref 19–32)
GFR, Est African American: >89 mL/min
GFR, Est Non African American: >89 mL/min
Glucose, Bld: 87 mg/dL (ref 70–99)
Sodium: 140 mEq/L (ref 135–145)
Total Bilirubin: 0.4 mg/dL (ref 0.3–1.2)
Total Protein: 6.6 g/dL (ref 6.0–8.3)

## 2012-06-30 LAB — CBC WITH DIFFERENTIAL/PLATELET
Basophils Relative: 1 % (ref 0–1)
Eosinophils Absolute: 0.1 10*3/uL (ref 0.0–0.7)
Eosinophils Relative: 2 % (ref 0–5)
HCT: 31.9 % — ABNORMAL LOW (ref 36.0–46.0)
Hemoglobin: 10.2 g/dL — ABNORMAL LOW (ref 12.0–15.0)
Lymphs Abs: 1.3 10*3/uL (ref 0.7–4.0)
MCH: 22.7 pg — ABNORMAL LOW (ref 26.0–34.0)
MCHC: 32 g/dL (ref 30.0–36.0)
MCV: 71 fL — ABNORMAL LOW (ref 78.0–100.0)
Monocytes Absolute: 0.6 10*3/uL (ref 0.1–1.0)
Monocytes Relative: 11 % (ref 3–12)
RBC: 4.49 MIL/uL (ref 3.87–5.11)

## 2012-06-30 NOTE — Progress Notes (Signed)
Subjective:    Patient ID: Teresa Doyle, female    DOB: 01/20/1966, 47 y.o.   MRN: [id number]  HPI  47 year old African American lady with HIV/AIDS with PCP pneumonia diagnosed this fall as responded very well to Prezista Norvir Truvada with viral load has dropped to nearly 96 from the upper 100,000 and above range. Her CD4 count has climbed from 40 now to over 320.    In the interim he has experienced symptoms of a rash around her neck as well as pain in her legs and thighs. I believe most of these symptoms are largely due to immune reconstitution inflammatory syndrome as it corresponded with the very nice drop in her viral load and nearly 10 fold increase in her CD4 count.  Her dosing supple fixation we decided to change her to [facility].  She has done well since then though she has been complaining of extreme fatigue and she treated this possibly being good due to the [facility].   I offered her other ARV regimens including Tivicay and Truvada but she wished to remain on STRIBILD for now.     Review of Systems  Constitutional: Negative for fever, chills, diaphoresis, activity change, appetite change, fatigue and unexpected weight change.  HENT: Negative for congestion, sore throat, rhinorrhea, sneezing, trouble swallowing and sinus pressure.   Eyes: Negative for photophobia and visual disturbance.  Respiratory: Negative for cough, chest tightness, shortness of breath, wheezing and stridor.   Cardiovascular: Negative for chest pain, palpitations and leg swelling.  Gastrointestinal: Negative for nausea, vomiting, abdominal pain, diarrhea, constipation, blood in stool, abdominal distention and anal bleeding.  Genitourinary: Negative for dysuria, hematuria, flank pain and difficulty urinating.  Musculoskeletal: Positive for arthralgias. Negative for myalgias, back pain, joint swelling and gait problem.  Skin: Negative for color change, pallor, rash and wound.  Neurological: Negative for  dizziness, tremors, weakness and light-headedness.  Hematological: Negative for adenopathy. Does not bruise/bleed easily.  Psychiatric/Behavioral: Negative for behavioral problems, confusion, sleep disturbance, dysphoric mood, decreased concentration and agitation.       Objective:   Physical Exam  Constitutional: She is oriented to person, place, and time. She appears well-developed and well-nourished. No distress.  HENT:  Head: Normocephalic and atraumatic.  Mouth/Throat: Oropharynx is clear and moist. No oropharyngeal exudate.  Eyes: Conjunctivae and EOM are normal. Pupils are equal, round, and reactive to light. No scleral icterus.  Neck: Normal range of motion. Neck supple. No JVD present.  Cardiovascular: Normal rate, regular rhythm and normal heart sounds.  Exam reveals no gallop and no friction rub.   No murmur heard. Pulmonary/Chest: Effort normal and breath sounds normal. No respiratory distress. She has no wheezes. She has no rales. She exhibits no tenderness.  Abdominal: She exhibits no distension and no mass. There is no tenderness. There is no rebound and no guarding.  Musculoskeletal: She exhibits no edema and no tenderness.  Lymphadenopathy:    She has no cervical adenopathy.  Neurological: She is alert and oriented to person, place, and time. She has normal reflexes. She exhibits normal muscle tone. Coordination normal.  Skin: Skin is warm and dry. She is not diaphoretic. No erythema. No pallor.  Psychiatric: She has a normal mood and affect. Her behavior is normal. Judgment and thought content normal.          Assessment & Plan:  HIV: Check viral load and CD4 count today Continue, STRIBILD, bring back in 3 months. DC bactrim  Fatigue; not clear what this  is. IF she still feels this is due to [facility] will change her to Tivicay and Truvada or new Tri pill if available. She takes pPI for acid reflux making complera less desirable  Myalgias: Likely was due to immune  reconstitution inflammatory syndrome versus possible deconditioning and now adjustment to workload, has resolved  Hypertension: Much better controlled

## 2012-07-02 LAB — T-HELPER CELL (CD4) - (RCID CLINIC ONLY): CD4 T Cell Abs: 360 uL — ABNORMAL LOW (ref 400–2700)

## 2012-07-12 ENCOUNTER — Other Ambulatory Visit: Payer: Self-pay

## 2012-07-12 DIAGNOSIS — A6000 Herpesviral infection of urogenital system, unspecified: Secondary | ICD-10-CM

## 2012-07-12 DIAGNOSIS — B20 Human immunodeficiency virus [HIV] disease: Secondary | ICD-10-CM

## 2012-07-12 DIAGNOSIS — R05 Cough: Secondary | ICD-10-CM

## 2012-07-12 MED ORDER — FLUCONAZOLE 100 MG PO TABS: 100.0000 mg | ORAL_TABLET | Freq: Every day | ORAL | Status: DC

## 2012-07-12 MED ORDER — VALACYCLOVIR HCL 1 G PO TABS: 1000.0000 mg | ORAL_TABLET | Freq: Two times a day (BID) | ORAL | Status: DC | PRN

## 2012-07-14 ENCOUNTER — Telehealth: Payer: Self-pay | Admitting: Licensed Clinical Social Worker

## 2012-07-14 NOTE — Telephone Encounter (Signed)
That is fine with me. There will likely be no difference in weight with change back!

## 2012-07-14 NOTE — Telephone Encounter (Signed)
Patient would like to return back to her old regimen due to a 10 pound weight gain. She is currently on stribild, She was on prezista, truvada, and norvir.

## 2012-07-15 ENCOUNTER — Other Ambulatory Visit: Payer: Self-pay | Admitting: Licensed Clinical Social Worker

## 2012-07-15 ENCOUNTER — Other Ambulatory Visit: Payer: Self-pay | Admitting: *Deleted

## 2012-07-15 DIAGNOSIS — B20 Human immunodeficiency virus [HIV] disease: Secondary | ICD-10-CM

## 2012-07-15 DIAGNOSIS — A6000 Herpesviral infection of urogenital system, unspecified: Secondary | ICD-10-CM

## 2012-07-15 MED ORDER — RITONAVIR 100 MG PO TABS: 100.0000 mg | ORAL_TABLET | Freq: Every day | ORAL | Status: DC

## 2012-07-15 MED ORDER — DARUNAVIR ETHANOLATE 800 MG PO TABS: 800.0000 mg | ORAL_TABLET | Freq: Every day | ORAL | Status: DC

## 2012-07-15 MED ORDER — EMTRICITABINE-TENOFOVIR DF 200-300 MG PO TABS: 1.0000 | ORAL_TABLET | Freq: Every day | ORAL | Status: DC

## 2012-07-15 MED ORDER — FLUCONAZOLE 100 MG PO TABS: 100.0000 mg | ORAL_TABLET | Freq: Every day | ORAL | Status: DC

## 2012-07-15 MED ORDER — VALACYCLOVIR HCL 1 G PO TABS: 1000.0000 mg | ORAL_TABLET | Freq: Two times a day (BID) | ORAL | Status: DC | PRN

## 2012-08-03 ENCOUNTER — Encounter: Payer: Self-pay | Admitting: *Deleted

## 2012-08-04 ENCOUNTER — Telehealth: Payer: Self-pay | Admitting: *Deleted

## 2012-08-04 NOTE — Telephone Encounter (Signed)
RN spoke with the pt.  Asked whether menses started back after resuming her previous regimen.  Menses have not started back.  RN asked that pt when any of her female family members started menopause.  Pt did not know.  Pt receives GYN care from [facility] in [place].  Pt shared that she has not had a recent PAP smear or office visit.  Advised pt to call [facility] for an appointment to discuss this issue and obtain a PAP smear.  Pt related that she has had a previous abnormal PAP smear.  RN requested that the pt ask [facility] to fax a copy of the PAP smear results to [facility] when they are available.  Pt verbalized she would call for an appt and ask results to be faxed.

## 2012-08-24 ENCOUNTER — Encounter: Payer: Self-pay | Admitting: Internal Medicine

## 2012-09-06 ENCOUNTER — Ambulatory Visit (INDEPENDENT_AMBULATORY_CARE_PROVIDER_SITE_OTHER): Payer: Medicaid Other | Admitting: Medical

## 2012-09-06 ENCOUNTER — Encounter: Payer: Self-pay | Admitting: Medical

## 2012-09-06 ENCOUNTER — Other Ambulatory Visit (HOSPITAL_COMMUNITY)
Admission: RE | Admit: 2012-09-06 | Discharge: 2012-09-06 | Disposition: A | Discharge status: Home / Self Care | Payer: Medicaid Other | Source: Ambulatory Visit | Attending: Medical | Admitting: Medical

## 2012-09-06 VITALS — BP 138/90 | HR 68 | Temp 98.0°F | Resp 16 | Wt 228.0 lb

## 2012-09-06 DIAGNOSIS — I10 Essential (primary) hypertension: Secondary | ICD-10-CM

## 2012-09-06 DIAGNOSIS — R87810 Cervical high risk human papillomavirus (HPV) DNA test positive: Secondary | ICD-10-CM | POA: Insufficient documentation

## 2012-09-06 DIAGNOSIS — B20 Human immunodeficiency virus [HIV] disease: Secondary | ICD-10-CM

## 2012-09-06 DIAGNOSIS — Z0000 Encounter for general adult medical examination without abnormal findings: Secondary | ICD-10-CM

## 2012-09-06 DIAGNOSIS — D649 Anemia, unspecified: Secondary | ICD-10-CM

## 2012-09-06 DIAGNOSIS — Z01419 Encounter for gynecological examination (general) (routine) without abnormal findings: Secondary | ICD-10-CM | POA: Diagnosis not present

## 2012-09-06 DIAGNOSIS — Z124 Encounter for screening for malignant neoplasm of cervix: Secondary | ICD-10-CM | POA: Diagnosis not present

## 2012-09-06 DIAGNOSIS — R32 Unspecified urinary incontinence: Secondary | ICD-10-CM

## 2012-09-06 DIAGNOSIS — Z1151 Encounter for screening for human papillomavirus (HPV): Secondary | ICD-10-CM | POA: Insufficient documentation

## 2012-09-06 DIAGNOSIS — D259 Leiomyoma of uterus, unspecified: Secondary | ICD-10-CM | POA: Diagnosis not present

## 2012-09-06 DIAGNOSIS — A6000 Herpesviral infection of urogenital system, unspecified: Secondary | ICD-10-CM | POA: Diagnosis not present

## 2012-09-06 DIAGNOSIS — Z113 Encounter for screening for infections with a predominantly sexual mode of transmission: Secondary | ICD-10-CM | POA: Insufficient documentation

## 2012-09-06 LAB — POCT URINALYSIS DIPSTICK
Glucose, UA: NEGATIVE
Leukocytes, UA: NEGATIVE
Nitrite, UA: NEGATIVE
Urobilinogen, UA: NEGATIVE

## 2012-09-06 LAB — HM PAP SMEAR

## 2012-09-06 NOTE — Progress Notes (Signed)
Subjective: Here for a physical.  I last saw her 4/13 for physical.  At that time she was suppose to f/u with gynecology for routine gynecology care and STD testing.   She notes that she never went to see gynecology.   She ended up coming back to see Dr. Susann Givens 11/2011 for shortness of breath.   After 2 rounds of antibiotics and no improvement, ended up going to the ED, was admitted for bilat pneumonia, and unfortunately was discovered to have PCP pneumonia and diagnosed with HIV.  Was subsequently started on antiviral medication, prophylactic antibiotics for opportunistic infection, and is being managed by infectious disease clinic.   Her most recent labs show much improvement on her antiviral regimen. This was devastating new to her.  Only her sisters, mother, and niece are aware.  Her children do not know about the diagnosis, and she has no plans to let them know anytime in the foreseeable future.   She initially had a hard time dealing with the diagnosis, but says she has come a long way, is doing much better handling things now.   She is very compliant with her medication regimen.    She is currently in no relationship.  She actually worries how she will ever find someone willing to be in a relationship with her given her new HIV status.  She has prayed about this and put this in God's hands.    She is here today for routine f/u/physical, needs pap smear, and has some concerns.  Mammogram was done recently, last pap few years ago.  Periods are quite heavy, lasts full 5 days.  Has hx/o uterine fibroids, and prior fibroid surgery.   Not currently taking iron for hx/o anemia.   Denies blood in stool or any other bleeding.   HTN - was on Ziac in the past, but after the sepsis and hospitalization 11/2011, her BP medication was stopped and she has never restarted the medication.   She says her BPs have been fine.   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  Urinary incontinence - for quite a while now has had  problems with incontinence.  Wears a pad daily.  Has difficulty making it to the rest room in time.  She also gets incontinence with coughing and laughing.   Had regular dribble of urine.  She has new darkening skin along her cheeks, assumes this is age related.     No other new c/o.   Past Medical History  Diagnosis Date  . Obesity   . Hypertension   . Fibroids uterine  . Suppurative hidradenitis     w/ prior multiple abscess, MRSA +  . Sinus problem     twice yearly   . Anemia     iron deficient, since teenage years  . Nearsightedness     wears glasses  . Genital HSV   . Urinary incontinence   . Normal labor and delivery     5/92 and 8/95  . HIV disease 11/2011    sees Infectious Disease Clinic    Past Surgical History  Procedure Laterality Date  . Myomectomy  2005  . Bilateral tubal ligation    . Wisdom tooth extraction    . Flexible sigmoidoscopy  2005    due to abdominal pain; normal    Family History  Problem Relation Age of Onset  . Diabetes Neg Hx   . Stroke Neg Hx   . Cancer Neg Hx   . Heart  disease Neg Hx   . Hypertension Neg Hx   . Hyperlipidemia Mother     History   Social History  . Marital Status: Divorced    Spouse Name: N/A    Number of Children: 2  . 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 Smoking status: Never Smoker   . Smokeless tobacco: Never Used  . Alcohol Use: No     Comment: rare  . Drug Use: No  . Sexually Active: Not on file   Other Topics Concern  . Not on file   Social History Narrative   Lives with her 2 children (1 daughter, 1 son), dog; single, exercise - walking, sometimes treadmill with fitness center at work    Current Outpatient Prescriptions on File Prior to Visit  Medication Sig Dispense Refill  . albuterol (PROVENTIL HFA;VENTOLIN HFA) 108 (90 BASE) MCG/ACT inhaler Inhale 2 puffs into the lungs every 6 (six) hours as needed  for wheezing.  1 Inhaler  0  . Darunavir Ethanolate (PREZISTA) 800 MG tablet Take 1 tablet (800 mg total) by mouth daily.  30 tablet  11  . emtricitabine-tenofovir (TRUVADA) 200-300 MG per tablet Take 1 tablet by mouth daily.  30 tablet  11  . fluconazole (DIFLUCAN) 100 MG tablet Take 1 tablet (100 mg total) by mouth daily.  14 tablet  3  . ritonavir (NORVIR) 100 MG TABS Take 1 tablet (100 mg total) by mouth daily.  30 tablet  11  . valACYclovir (VALTREX) 1000 MG tablet Take 1 tablet (1,000 mg total) by mouth 2 (two) times daily as needed. For outbreak  30 tablet  3  . bisoprolol-hydrochlorothiazide (ZIAC) 10-6.25 MG per tablet TAKE ONE TABLET BY MOUTH EVERY DAY  90 tablet  0   No current facility-administered medications on file prior to visit.    No Known Allergies    Objective:   Physical Exam  Filed Vitals:   09/06/12 1447  BP: 138/90  Pulse: 68  Temp: 98 F (36.7 C)  Resp: 16    General appearance: alert, no distress, WD/WN, AA female Skin: cheeks bilat with increased brown patchiness, scattered macules, no worrisome lesions HEENT: normocephalic, conjunctiva/corneas normal, sclerae anicteric, PERRLA, EOMi, nares patent, no discharge or erythema, pharynx normal Oral cavity: MMM, tongue normal, teeth in good repair Neck: supple, no lymphadenopathy, no thyromegaly, no masses, normal ROM, no bruits Chest: non tender, normal shape and expansion Heart: RRR, normal S1, S2, no murmurs Lungs: CTA bilaterally, no wheezes, rhonchi, or rales Abdomen: +bs, soft, non tender, non distended, no masses, no hepatomegaly, no splenomegaly, no bruits Back: non tender, normal ROM, no scoliosis Musculoskeletal: upper extremities non tender, no obvious deformity, normal ROM throughout, lower extremities non tender, no obvious deformity, normal ROM throughout Extremities: no edema, no cyanosis, no clubbing Pulses: 2+ symmetric, upper and lower extremities, normal cap refill Neurological: alert,  oriented x 3, CN2-12 intact, strength normal upper extremities and lower extremities, sensation normal throughout, DTRs 2+ throughout, no cerebellar signs, gait normal Psychiatric: normal affect, behavior normal, pleasant  Breast: nontender, no masses or lumps, no skin changes, no nipple discharge or inversion, no axillary lymphadenopathy Gyn: Normal external genitalia without lesions, there seems to be slight bulge of the urethra and bladder suggesting mild cystocele, vagina with normal mucosa, cervix without lesions, no cervical motion tenderness, no abnormal vaginal discharge.  Uterus does appear to be  somewhat irregular and enlarged, adnexa not enlarged, nontender, no masses.  Pap performed.  Exam chaperoned by nurse. Rectal: anus normal tone, occult negative stool    Assessment and Plan :    Encounter Diagnoses  Name Primary?  . Routine general medical examination at a health care facility Yes  . Urinary incontinence   . Anemia   . HIV disease   . Genital HSV   . Essential hypertension, benign   . Uterine fibroid   . Screening for cervical cancer     Physical exam - discussed staying healthy, exercising, eating healthy.  Given her limited insurance coverage, she declines labs today.   She is getting some routine labs through infectious disease clinic.  I advised she consider Living Will and Advanced Directives since she has never done this prior.  Urinary incontinence - will refer back to gynecology.  I would rather avoid possible medication interactions with anticholinergics.   She is using kegel exercises.  Anemia - likely due to heavy periods.   Refer back to gynecology.  HIV disease - Reviewed most recent HIV viral loads, CD4 counts.   She is managed by infectious disease clinic.  We discussed her diagnosis, hospitalization, subsequent care and mental state at length.  She is doing reasonably well handling the diagnosis and changes in her life.   Advised she return there for Hep  A vaccination, consideration of daily suppressive therapy for her HSV genital.    HTN -  will monitor, but will likely need to restart medication  Uterine fibroid - Referral back to gynecology.  Screening - pap sent today

## 2012-09-07 ENCOUNTER — Encounter: Payer: Self-pay | Admitting: Internal Medicine

## 2012-09-07 ENCOUNTER — Telehealth: Payer: Self-pay | Admitting: Internal Medicine

## 2012-09-07 DIAGNOSIS — D259 Leiomyoma of uterus, unspecified: Secondary | ICD-10-CM

## 2012-09-07 DIAGNOSIS — N920 Excessive and frequent menstruation with regular cycle: Secondary | ICD-10-CM

## 2012-09-07 NOTE — Telephone Encounter (Signed)
Message copied by Joslyn Hy on Tue Sep 07, 2012 10:57 AM ------      Message from: Jac Canavan      Created: Mon Sep 06, 2012  9:25 PM       Incontinence - I recommend we consider avoiding medication at this time due to medication interactions.  Refer to gynecology for incontinence and instead refer to gynecology for uterine fibroids, heavy bleeding, and incontinence.  She has seen gynecology prior, so refer back to her prior gynecologist.            I recommend she have the Hepatitis A vaccination through Infectious Disease clinic.  Get flu vaccine in October.  Avoid LIVE vaccines.             If she is not already aware of this, avoid tap water, ice made with tap water, avoid raw foods.  Make sure her foods are washed and cooked in general to avoid opportunistic infection.   Use bottled water/treated water, not tap or distilled.            If she has any pets, avoid handling reptiles, birds, avoid pet feces, avoid raw meat.  These are all measures to avoid opportunistic infection.              Lets have her check her BPs and get me readings in 3-4 weeks.   If BP stays elevated, will likely need to restart BP medication.            At some point we will need to recheck glucose, lipids.   She may want to check with insurance or ID about labs routinely for surveillance of lipids and glucose.  I have reviewed most recent labs in chart.              ------

## 2012-09-07 NOTE — Telephone Encounter (Signed)
Set up gynecology appt with Dr. Neva Seat August 19,2014 @1 :30. Doctor is located at [facility] center @ [phone]. i have called pt and asked to call me back

## 2012-09-20 ENCOUNTER — Other Ambulatory Visit (INDEPENDENT_AMBULATORY_CARE_PROVIDER_SITE_OTHER): Payer: Medicaid Other

## 2012-09-20 DIAGNOSIS — B20 Human immunodeficiency virus [HIV] disease: Secondary | ICD-10-CM

## 2012-09-20 LAB — CBC WITH DIFFERENTIAL/PLATELET
Basophils Absolute: 0 10*3/uL (ref 0.0–0.1)
Basophils Relative: 0 % (ref 0–1)
Lymphocytes Relative: 25 % (ref 12–46)
MCHC: 32 g/dL (ref 30.0–36.0)
Neutro Abs: 4.4 10*3/uL (ref 1.7–7.7)
Platelets: 286 10*3/uL (ref 150–400)
RDW: 17.4 % — ABNORMAL HIGH (ref 11.5–15.5)
WBC: 6.9 10*3/uL (ref 4.0–10.5)

## 2012-09-20 LAB — COMPLETE METABOLIC PANEL WITH GFR
Albumin: 3.9 g/dL (ref 3.5–5.2)
BUN: 10 mg/dL (ref 6–23)
CO2: 24 mEq/L (ref 19–32)
Calcium: 9.2 mg/dL (ref 8.4–10.5)
Chloride: 108 mEq/L (ref 96–112)
GFR, Est African American: >89 mL/min
GFR, Est Non African American: >89 mL/min
Glucose, Bld: 94 mg/dL (ref 70–99)
Potassium: 4.3 mEq/L (ref 3.5–5.3)
Total Protein: 6.3 g/dL (ref 6.0–8.3)

## 2012-09-21 LAB — T-HELPER CELL (CD4) - (RCID CLINIC ONLY): CD4 T Cell Abs: 470 uL (ref 400–2700)

## 2012-09-22 LAB — HIV-1 RNA QUANT-NO REFLEX-BLD: HIV-1 RNA Quant, Log: 1.32 {Log} — ABNORMAL HIGH (ref <1.30)

## 2012-10-12 ENCOUNTER — Ambulatory Visit: Payer: Self-pay

## 2012-10-12 ENCOUNTER — Encounter: Payer: Self-pay | Admitting: Infectious Disease

## 2012-10-12 ENCOUNTER — Ambulatory Visit (INDEPENDENT_AMBULATORY_CARE_PROVIDER_SITE_OTHER): Payer: Medicaid Other | Admitting: Infectious Disease

## 2012-10-12 VITALS — BP 141/83 | HR 69 | Temp 98.0°F | Wt 237.0 lb

## 2012-10-12 DIAGNOSIS — I10 Essential (primary) hypertension: Secondary | ICD-10-CM

## 2012-10-12 DIAGNOSIS — B20 Human immunodeficiency virus [HIV] disease: Secondary | ICD-10-CM

## 2012-10-12 DIAGNOSIS — Z23 Encounter for immunization: Secondary | ICD-10-CM

## 2012-10-12 MED ORDER — HYDROCHLOROTHIAZIDE 25 MG PO TABS: 25.0000 mg | ORAL_TABLET | Freq: Every day | ORAL | Status: DC

## 2012-10-12 NOTE — Progress Notes (Signed)
  Subjective:    Patient ID: Teresa Doyle, female    DOB: [birth year], 47 y.o.   MRN: [id number]  HPI   47 year old African American lady with HIV/AIDS with PCP pneumonia diagnosed  Nearly a year ago, who responded very well to Prezista Norvir Truvada, then switched to [facility] but with problems with myalgias, changed back to prezista, norvir and truvada who has had nice virological suppression and immune reconstitution. She states that her BP at home has been increasing and believes that she needs to go back on anti-HTNsive.   We discussed option to go to Triumeq if covered by Medicaid.     Review of Systems  Constitutional: Negative for fever, chills, diaphoresis, activity change, appetite change, fatigue and unexpected weight change.  HENT: Negative for congestion, sore throat, rhinorrhea, sneezing, trouble swallowing and sinus pressure.   Eyes: Negative for photophobia and visual disturbance.  Respiratory: Negative for cough, chest tightness, shortness of breath, wheezing and stridor.   Cardiovascular: Negative for chest pain, palpitations and leg swelling.  Gastrointestinal: Negative for nausea, vomiting, abdominal pain, diarrhea, constipation, blood in stool, abdominal distention and anal bleeding.  Genitourinary: Negative for dysuria, hematuria, flank pain and difficulty urinating.  Musculoskeletal: Negative for myalgias, back pain, joint swelling, arthralgias and gait problem.  Skin: Negative for color change, pallor, rash and wound.  Neurological: Negative for dizziness, tremors, weakness and light-headedness.  Hematological: Negative for adenopathy. Does not bruise/bleed easily.  Psychiatric/Behavioral: Negative for behavioral problems, confusion, sleep disturbance, dysphoric mood, decreased concentration and agitation.       Objective:   Physical Exam  Constitutional: She is oriented to person, place, and time. She appears well-developed and well-nourished. No distress.   HENT:  Head: Normocephalic and atraumatic.  Mouth/Throat: Oropharynx is clear and moist. No oropharyngeal exudate.  Eyes: Conjunctivae and EOM are normal. Pupils are equal, round, and reactive to light. No scleral icterus.  Neck: Normal range of motion. Neck supple. No JVD present.  Cardiovascular: Normal rate, regular rhythm and normal heart sounds.  Exam reveals no gallop and no friction rub.   No murmur heard. Pulmonary/Chest: Effort normal and breath sounds normal. No respiratory distress. She has no wheezes. She has no rales. She exhibits no tenderness.  Abdominal: She exhibits no distension and no mass. There is no tenderness. There is no rebound and no guarding.  Musculoskeletal: She exhibits no edema and no tenderness.  Lymphadenopathy:    She has no cervical adenopathy.  Neurological: She is alert and oriented to person, place, and time. She has normal reflexes. She exhibits normal muscle tone. Coordination normal.  Skin: Skin is warm and dry. She is not diaphoretic. No erythema. No pallor.  Psychiatric: She has a normal mood and affect. Her behavior is normal. Judgment and thought content normal.          Assessment & Plan:  HIV: continue Prezista, norvir and truvada for now consider Triumeq in the future   Myalgias: Likely was due to immune reconstitution inflammatory syndrome versus possible deconditioning and now adjustment to workload, has resolved  Hypertension: add back hctz  Obesity: will try to lose weight

## 2013-01-13 ENCOUNTER — Encounter: Payer: Self-pay | Admitting: Infectious Disease

## 2013-01-20 ENCOUNTER — Other Ambulatory Visit: Payer: Self-pay | Admitting: *Deleted

## 2013-01-20 DIAGNOSIS — A6000 Herpesviral infection of urogenital system, unspecified: Secondary | ICD-10-CM

## 2013-01-20 MED ORDER — VALACYCLOVIR HCL 1 G PO TABS: 1000.0000 mg | ORAL_TABLET | Freq: Two times a day (BID) | ORAL | Status: AC | PRN

## 2013-01-24 ENCOUNTER — Other Ambulatory Visit: Payer: Medicaid Other

## 2013-01-24 DIAGNOSIS — B20 Human immunodeficiency virus [HIV] disease: Secondary | ICD-10-CM

## 2013-01-24 LAB — CBC WITH DIFFERENTIAL/PLATELET
Basophils Absolute: 0 10*3/uL (ref 0.0–0.1)
Basophils Relative: 1 % (ref 0–1)
Eosinophils Absolute: 0.1 10*3/uL (ref 0.0–0.7)
HCT: 30.3 % — ABNORMAL LOW (ref 36.0–46.0)
Hemoglobin: 9.8 g/dL — ABNORMAL LOW (ref 12.0–15.0)
MCH: 23 pg — ABNORMAL LOW (ref 26.0–34.0)
MCHC: 32.3 g/dL (ref 30.0–36.0)
Monocytes Absolute: 0.6 10*3/uL (ref 0.1–1.0)
Monocytes Relative: 9 % (ref 3–12)
RDW: 17.1 % — ABNORMAL HIGH (ref 11.5–15.5)

## 2013-01-24 LAB — RPR

## 2013-01-24 LAB — LIPID PANEL
HDL: 80 mg/dL (ref >39)
LDL Cholesterol: 91 mg/dL (ref 0–99)

## 2013-01-24 LAB — COMPLETE METABOLIC PANEL WITH GFR
ALT: 15 U/L (ref 0–35)
Albumin: 3.9 g/dL (ref 3.5–5.2)
Alkaline Phosphatase: 84 U/L (ref 39–117)
CO2: 27 mEq/L (ref 19–32)
GFR, Est African American: >89 mL/min
Glucose, Bld: 86 mg/dL (ref 70–99)
Potassium: 3.9 mEq/L (ref 3.5–5.3)
Sodium: 139 mEq/L (ref 135–145)
Total Bilirubin: 0.5 mg/dL (ref 0.3–1.2)
Total Protein: 6.1 g/dL (ref 6.0–8.3)

## 2013-01-28 LAB — HLA B*5701: HLA-B*5701 w/rflx HLA-B High: NEGATIVE

## 2013-02-14 ENCOUNTER — Ambulatory Visit (INDEPENDENT_AMBULATORY_CARE_PROVIDER_SITE_OTHER): Payer: Medicaid Other | Admitting: Infectious Disease

## 2013-02-14 ENCOUNTER — Encounter: Payer: Self-pay | Admitting: Infectious Disease

## 2013-02-14 VITALS — BP 151/85 | HR 73 | Wt 237.0 lb

## 2013-02-14 DIAGNOSIS — B20 Human immunodeficiency virus [HIV] disease: Secondary | ICD-10-CM

## 2013-02-14 LAB — CK: CK TOTAL: 115 U/L (ref 7–177)

## 2013-02-14 MED ORDER — ABACAVIR-DOLUTEGRAVIR-LAMIVUD 600-50-300 MG PO TABS: 1.0000 | ORAL_TABLET | Freq: Every day | ORAL | Status: DC

## 2013-02-14 NOTE — Progress Notes (Signed)
Subjective:    Patient ID: Teresa Doyle, female    DOB: 1965-06-04, 48 y.o.   MRN: [id number]  HPI   48 year old African American lady with HIV/AIDS with PCP pneumonia diagnosed  Nearly a year ago, who responded very well to Prezista Norvir Truvada, then switched to [facility] but with problems with myalgias, changed back to prezista, norvir and truvada who has had nice virological suppression and immune reconstitution. She has persistent problems with myalgias despite change back and states that she began having them as soon as she started ARV   We discussed option to go to Triumeq     Review of Systems  Constitutional: Negative for fever, chills, diaphoresis, activity change, appetite change, fatigue and unexpected weight change.  HENT: Negative for congestion, rhinorrhea, sinus pressure, sneezing, sore throat and trouble swallowing.   Eyes: Negative for photophobia and visual disturbance.  Respiratory: Negative for cough, chest tightness, shortness of breath, wheezing and stridor.   Cardiovascular: Negative for chest pain, palpitations and leg swelling.  Gastrointestinal: Negative for nausea, vomiting, abdominal pain, diarrhea, constipation, blood in stool, abdominal distention and anal bleeding.  Genitourinary: Negative for dysuria, hematuria, flank pain and difficulty urinating.  Musculoskeletal: Negative for arthralgias, back pain, gait problem, joint swelling and myalgias.  Skin: Negative for color change, pallor, rash and wound.  Neurological: Negative for dizziness, tremors, weakness and light-headedness.  Hematological: Negative for adenopathy. Does not bruise/bleed easily.  Psychiatric/Behavioral: Negative for behavioral problems, confusion, sleep disturbance, dysphoric mood, decreased concentration and agitation.       Objective:   Physical Exam  Constitutional: She is oriented to person, place, and time. She appears well-developed and well-nourished. No distress.  HENT:   Head: Normocephalic and atraumatic.  Mouth/Throat: Oropharynx is clear and moist. No oropharyngeal exudate.  Eyes: Conjunctivae and EOM are normal. Pupils are equal, round, and reactive to light. No scleral icterus.  Neck: Normal range of motion. Neck supple. No JVD present.  Cardiovascular: Normal rate, regular rhythm and normal heart sounds.  Exam reveals no gallop and no friction rub.   No murmur heard. Pulmonary/Chest: Effort normal and breath sounds normal. No respiratory distress. She has no wheezes. She has no rales. She exhibits no tenderness.  Abdominal: She exhibits no distension and no mass. There is no tenderness. There is no rebound and no guarding.  Musculoskeletal: She exhibits no edema and no tenderness.  Lymphadenopathy:    She has no cervical adenopathy.  Neurological: She is alert and oriented to person, place, and time. She has normal reflexes. She exhibits normal muscle tone. Coordination normal.  Skin: Skin is warm and dry. She is not diaphoretic. No erythema. No pallor.  Psychiatric: She has a normal mood and affect. Her behavior is normal. Judgment and thought content normal.          Assessment & Plan:  HIV: change to  Triumeq I spent greater than 45 minutes with the patient including greater than 50% of time in face to face counsel of the patient and in coordination of their care. We reviewed risks benefits of Truvada vs Epzicom backbone therapy including DAD. I am comfortable going with aBC in this pt given lack of other stron  comorbid risk factors for CAD and given conflicting data from other studies re ABC    Myalgias:  Not sure if this is protracted IRIS, ADR. Will change her ARV as above. Will check CPK today   Hypertension: continue  hctz  Obesity: will try to lose  weight

## 2013-02-15 ENCOUNTER — Other Ambulatory Visit: Payer: Self-pay | Admitting: *Deleted

## 2013-02-15 DIAGNOSIS — B20 Human immunodeficiency virus [HIV] disease: Secondary | ICD-10-CM

## 2013-02-15 MED ORDER — FLUCONAZOLE 100 MG PO TABS: 100.0000 mg | ORAL_TABLET | Freq: Every day | ORAL | Status: DC

## 2013-03-21 ENCOUNTER — Other Ambulatory Visit (INDEPENDENT_AMBULATORY_CARE_PROVIDER_SITE_OTHER): Payer: Medicaid Other

## 2013-03-21 DIAGNOSIS — B20 Human immunodeficiency virus [HIV] disease: Secondary | ICD-10-CM

## 2013-03-21 LAB — CBC WITH DIFFERENTIAL/PLATELET
Basophils Absolute: 0 10*3/uL (ref 0.0–0.1)
Basophils Relative: 1 % (ref 0–1)
Eosinophils Absolute: 0.1 10*3/uL (ref 0.0–0.7)
Eosinophils Relative: 2 % (ref 0–5)
HCT: 30.3 % — ABNORMAL LOW (ref 36.0–46.0)
Hemoglobin: 9.4 g/dL — ABNORMAL LOW (ref 12.0–15.0)
LYMPHS ABS: 1.9 10*3/uL (ref 0.7–4.0)
Lymphocytes Relative: 35 % (ref 12–46)
MCH: 22.3 pg — ABNORMAL LOW (ref 26.0–34.0)
MCHC: 31 g/dL (ref 30.0–36.0)
MCV: 71.8 fL — ABNORMAL LOW (ref 78.0–100.0)
MONO ABS: 0.4 10*3/uL (ref 0.1–1.0)
Monocytes Relative: 8 % (ref 3–12)
NEUTROS ABS: 3 10*3/uL (ref 1.7–7.7)
Neutrophils Relative %: 54 % (ref 43–77)
Platelets: 330 10*3/uL (ref 150–400)
RBC: 4.22 MIL/uL (ref 3.87–5.11)
RDW: 18.5 % — ABNORMAL HIGH (ref 11.5–15.5)
WBC: 5.5 10*3/uL (ref 4.0–10.5)

## 2013-03-21 LAB — COMPLETE METABOLIC PANEL WITH GFR
ALT: 16 U/L (ref 0–35)
AST: 23 U/L (ref 0–37)
Albumin: 3.9 g/dL (ref 3.5–5.2)
Alkaline Phosphatase: 72 U/L (ref 39–117)
BILIRUBIN TOTAL: 0.3 mg/dL (ref 0.2–1.2)
BUN: 8 mg/dL (ref 6–23)
CO2: 26 mEq/L (ref 19–32)
Calcium: 8.7 mg/dL (ref 8.4–10.5)
Chloride: 106 mEq/L (ref 96–112)
Creat: 0.67 mg/dL (ref 0.50–1.10)
GFR, Est African American: >89 mL/min
GLUCOSE: 96 mg/dL (ref 70–99)
Potassium: 4.3 mEq/L (ref 3.5–5.3)
SODIUM: 139 meq/L (ref 135–145)
Total Protein: 6.3 g/dL (ref 6.0–8.3)

## 2013-03-22 LAB — HIV-1 RNA QUANT-NO REFLEX-BLD
HIV 1 RNA Quant: <20 copies/mL (ref <20)
HIV-1 RNA Quant, Log: <1.3 {Log} (ref <1.30)

## 2013-03-22 LAB — T-HELPER CELL (CD4) - (RCID CLINIC ONLY)
CD4 % Helper T Cell: 22 % — ABNORMAL LOW (ref 33–55)
CD4 T CELL ABS: 440 /uL (ref 400–2700)

## 2013-03-28 ENCOUNTER — Encounter: Payer: Self-pay | Admitting: Infectious Disease

## 2013-03-28 ENCOUNTER — Ambulatory Visit (INDEPENDENT_AMBULATORY_CARE_PROVIDER_SITE_OTHER): Payer: Medicaid Other | Admitting: Infectious Disease

## 2013-03-28 ENCOUNTER — Ambulatory Visit: Payer: Self-pay | Admitting: *Deleted

## 2013-03-28 VITALS — BP 150/97 | HR 67 | Temp 98.0°F | Wt 238.0 lb

## 2013-03-28 DIAGNOSIS — D259 Leiomyoma of uterus, unspecified: Secondary | ICD-10-CM

## 2013-03-28 DIAGNOSIS — D219 Benign neoplasm of connective and other soft tissue, unspecified: Secondary | ICD-10-CM

## 2013-03-28 DIAGNOSIS — I10 Essential (primary) hypertension: Secondary | ICD-10-CM

## 2013-03-28 DIAGNOSIS — N920 Excessive and frequent menstruation with regular cycle: Secondary | ICD-10-CM

## 2013-03-28 DIAGNOSIS — D509 Iron deficiency anemia, unspecified: Secondary | ICD-10-CM

## 2013-03-28 DIAGNOSIS — B20 Human immunodeficiency virus [HIV] disease: Secondary | ICD-10-CM

## 2013-03-28 NOTE — Progress Notes (Signed)
Subjective:    Patient ID: Teresa Doyle, female    DOB: 12-Nov-1965, 48 y.o.   MRN: [id number]  HPI   48 year old African American lady with HIV/AIDS with PCP pneumonia diagnosed more than a year ago, who responded very well to Prezista Norvir Truvada, then switched to [facility] but with problems with myalgias, changed back to prezista, norvir and truvada who has had nice virological suppression and immune reconstitution then changed to [facility] with perfect virological suppresion  We discussed labs incluiding her microcytic anemia, likely due to iron deficiency from heavy menses. She is to see gynecology soon. She is not taking iron. IF she does she needs to not take at the same time as her [place].    Review of Systems  Constitutional: Negative for fever, chills, diaphoresis, activity change, appetite change, fatigue and unexpected weight change.  HENT: Negative for congestion, rhinorrhea, sinus pressure, sneezing, sore throat and trouble swallowing.   Eyes: Negative for photophobia and visual disturbance.  Respiratory: Negative for cough, chest tightness, shortness of breath, wheezing and stridor.   Cardiovascular: Negative for chest pain, palpitations and leg swelling.  Gastrointestinal: Negative for nausea, vomiting, abdominal pain, diarrhea, constipation, blood in stool, abdominal distention and anal bleeding.  Genitourinary: Negative for dysuria, hematuria, flank pain and difficulty urinating.  Musculoskeletal: Negative for arthralgias, back pain, gait problem, joint swelling and myalgias.  Skin: Negative for color change, pallor, rash and wound.  Neurological: Negative for dizziness, tremors, weakness and light-headedness.  Hematological: Negative for adenopathy. Does not bruise/bleed easily.  Psychiatric/Behavioral: Negative for behavioral problems, confusion, sleep disturbance, dysphoric mood, decreased concentration and agitation.       Objective:   Physical Exam   Constitutional: She is oriented to person, place, and time. She appears well-developed and well-nourished. No distress.  HENT:  Head: Normocephalic and atraumatic.  Mouth/Throat: Oropharynx is clear and moist. No oropharyngeal exudate.  Eyes: Conjunctivae and EOM are normal. Pupils are equal, round, and reactive to light. No scleral icterus.  Neck: Normal range of motion. Neck supple. No JVD present.  Cardiovascular: Normal rate, regular rhythm and normal heart sounds.  Exam reveals no gallop and no friction rub.   No murmur heard. Pulmonary/Chest: Effort normal and breath sounds normal. No respiratory distress. She has no wheezes. She has no rales. She exhibits no tenderness.  Abdominal: She exhibits no distension and no mass. There is no tenderness. There is no rebound and no guarding.  Musculoskeletal: She exhibits no edema and no tenderness.  Lymphadenopathy:    She has no cervical adenopathy.  Neurological: She is alert and oriented to person, place, and time. She has normal reflexes. She exhibits normal muscle tone. Coordination normal.  Skin: Skin is warm and dry. She is not diaphoretic. No erythema. No pallor.  Psychiatric: She has a normal mood and affect. Her behavior is normal. Judgment and thought content normal.          Assessment & Plan:  HIV: continue Triumeq I spent greater than 25 minutes with the patient including greater than 50% of time in face to face counsel of the patient and in coordination of their care.   She can come back to clinic in roughly 5 months for labs and ADAP renewal  Hypertension: continue  hctz  Obesity: will try to lose weight  Microcytic anemia: likely from heavy menses. Counselled her to take iron but NOT at the same time as her ARV meds. She is going to be evaluated and treated by  Gyn and also for her fibroids.

## 2013-04-04 ENCOUNTER — Other Ambulatory Visit: Payer: Self-pay | Admitting: *Deleted

## 2013-04-04 DIAGNOSIS — B20 Human immunodeficiency virus [HIV] disease: Secondary | ICD-10-CM

## 2013-04-04 MED ORDER — ABACAVIR-DOLUTEGRAVIR-LAMIVUD 600-50-300 MG PO TABS: 1.0000 | ORAL_TABLET | Freq: Every day | ORAL | Status: DC

## 2013-04-30 LAB — HM MAMMOGRAPHY

## 2013-05-03 ENCOUNTER — Encounter: Payer: Self-pay | Admitting: Internal Medicine

## 2013-05-11 DIAGNOSIS — Z90710 Acquired absence of both cervix and uterus: Secondary | ICD-10-CM

## 2013-05-11 HISTORY — PX: BILATERAL SALPINGECTOMY: SHX5743

## 2013-05-11 HISTORY — PX: OVARIAN CYST REMOVAL: SHX89

## 2013-05-11 HISTORY — DX: Acquired absence of both cervix and uterus: Z90.710

## 2013-05-11 HISTORY — PX: LAPAROSCOPIC HYSTERECTOMY: SHX1926

## 2013-06-16 ENCOUNTER — Encounter: Payer: Self-pay | Admitting: Medical

## 2013-08-10 ENCOUNTER — Other Ambulatory Visit (INDEPENDENT_AMBULATORY_CARE_PROVIDER_SITE_OTHER): Payer: Self-pay

## 2013-08-10 DIAGNOSIS — B20 Human immunodeficiency virus [HIV] disease: Secondary | ICD-10-CM

## 2013-08-10 LAB — COMPLETE METABOLIC PANEL WITH GFR
ALK PHOS: 65 U/L (ref 39–117)
ALT: 12 U/L (ref 0–35)
AST: 18 U/L (ref 0–37)
Albumin: 3.7 g/dL (ref 3.5–5.2)
BUN: 12 mg/dL (ref 6–23)
CALCIUM: 8.8 mg/dL (ref 8.4–10.5)
CO2: 28 mEq/L (ref 19–32)
Chloride: 105 mEq/L (ref 96–112)
Creat: 0.63 mg/dL (ref 0.50–1.10)
GFR, Est African American: >89 mL/min
Glucose, Bld: 89 mg/dL (ref 70–99)
Potassium: 4 mEq/L (ref 3.5–5.3)
Sodium: 140 mEq/L (ref 135–145)
Total Bilirubin: 0.4 mg/dL (ref 0.2–1.2)
Total Protein: 6.4 g/dL (ref 6.0–8.3)

## 2013-08-10 LAB — CBC WITH DIFFERENTIAL/PLATELET
BASOS ABS: 0.1 10*3/uL (ref 0.0–0.1)
BASOS PCT: 1 % (ref 0–1)
EOS PCT: 2 % (ref 0–5)
Eosinophils Absolute: 0.1 10*3/uL (ref 0.0–0.7)
HEMATOCRIT: 34.2 % — AB (ref 36.0–46.0)
Hemoglobin: 11.1 g/dL — ABNORMAL LOW (ref 12.0–15.0)
Lymphocytes Relative: 32 % (ref 12–46)
Lymphs Abs: 2.1 10*3/uL (ref 0.7–4.0)
MCH: 22.8 pg — AB (ref 26.0–34.0)
MCHC: 32.5 g/dL (ref 30.0–36.0)
MCV: 70.4 fL — ABNORMAL LOW (ref 78.0–100.0)
Monocytes Absolute: 0.5 10*3/uL (ref 0.1–1.0)
Monocytes Relative: 8 % (ref 3–12)
Neutro Abs: 3.8 10*3/uL (ref 1.7–7.7)
Neutrophils Relative %: 57 % (ref 43–77)
Platelets: 296 10*3/uL (ref 150–400)
RBC: 4.86 MIL/uL (ref 3.87–5.11)
RDW: 17.5 % — AB (ref 11.5–15.5)
WBC: 6.7 10*3/uL (ref 4.0–10.5)

## 2013-08-10 LAB — RPR

## 2013-08-11 LAB — T-HELPER CELL (CD4) - (RCID CLINIC ONLY)
CD4 % Helper T Cell: 24 % — ABNORMAL LOW (ref 33–55)
CD4 T Cell Abs: 530 /uL (ref 400–2700)

## 2013-08-12 LAB — HIV-1 RNA QUANT-NO REFLEX-BLD: HIV 1 RNA Quant: <20 copies/mL (ref <20)

## 2013-08-24 ENCOUNTER — Ambulatory Visit: Payer: Medicaid Other | Admitting: Infectious Disease

## 2013-08-30 ENCOUNTER — Ambulatory Visit: Payer: Medicaid Other | Admitting: Infectious Disease

## 2013-09-12 ENCOUNTER — Encounter: Payer: Self-pay | Admitting: Infectious Disease

## 2013-09-12 ENCOUNTER — Ambulatory Visit (INDEPENDENT_AMBULATORY_CARE_PROVIDER_SITE_OTHER): Payer: Self-pay | Admitting: Infectious Disease

## 2013-09-12 VITALS — BP 127/87 | HR 75 | Temp 98.2°F | Wt 230.0 lb

## 2013-09-12 DIAGNOSIS — B20 Human immunodeficiency virus [HIV] disease: Secondary | ICD-10-CM

## 2013-09-12 DIAGNOSIS — I10 Essential (primary) hypertension: Secondary | ICD-10-CM

## 2013-09-12 DIAGNOSIS — IMO0001 Reserved for inherently not codable concepts without codable children: Secondary | ICD-10-CM

## 2013-09-12 DIAGNOSIS — M2550 Pain in unspecified joint: Secondary | ICD-10-CM

## 2013-09-12 LAB — SEDIMENTATION RATE: SED RATE: 8 mm/h (ref 0–22)

## 2013-09-12 NOTE — Progress Notes (Signed)
  Subjective:    Patient ID: Teresa Doyle, female    DOB: 03-Jan-1966, 48 y.o.   MRN: [id number]  HPI   48 year old African American lady with HIV/AIDS with PCP pneumonia diagnosed more than a year ago, who responded very well to Prezista Norvir Truvada, then switched to [facility] but with problems with myalgias, changed back to prezista, norvir and truvada who has had nice virological suppression and immune reconstitution then changed to [facility] with perfect virological suppresion  She is still suffering from some diffuse myalgias of unclear cause.    Review of Systems  Constitutional: Negative for fever, chills, diaphoresis, activity change, appetite change, fatigue and unexpected weight change.  HENT: Negative for congestion, rhinorrhea, sinus pressure, sneezing, sore throat and trouble swallowing.   Eyes: Negative for photophobia and visual disturbance.  Respiratory: Negative for cough, chest tightness, shortness of breath, wheezing and stridor.   Cardiovascular: Negative for chest pain, palpitations and leg swelling.  Gastrointestinal: Negative for nausea, vomiting, abdominal pain, diarrhea, constipation, blood in stool, abdominal distention and anal bleeding.  Genitourinary: Negative for dysuria, hematuria, flank pain and difficulty urinating.  Musculoskeletal: Positive for myalgias. Negative for arthralgias, back pain, gait problem and joint swelling.  Skin: Negative for color change, pallor, rash and wound.  Neurological: Negative for dizziness, tremors, weakness and light-headedness.  Hematological: Negative for adenopathy. Does not bruise/bleed easily.  Psychiatric/Behavioral: Negative for behavioral problems, confusion, sleep disturbance, dysphoric mood, decreased concentration and agitation.       Objective:   Physical Exam  Constitutional: She is oriented to person, place, and time. She appears well-developed and well-nourished. No distress.  HENT:  Head: Normocephalic and  atraumatic.  Mouth/Throat: Oropharynx is clear and moist. No oropharyngeal exudate.  Eyes: Conjunctivae and EOM are normal. Pupils are equal, round, and reactive to light. No scleral icterus.  Neck: Normal range of motion. Neck supple. No JVD present.  Cardiovascular: Normal rate, regular rhythm and normal heart sounds.  Exam reveals no gallop and no friction rub.   No murmur heard. Pulmonary/Chest: Effort normal and breath sounds normal. No respiratory distress. She has no wheezes. She has no rales. She exhibits no tenderness.  Abdominal: She exhibits no distension and no mass. There is no tenderness. There is no rebound and no guarding.  Musculoskeletal: She exhibits no edema and no tenderness.  Lymphadenopathy:    She has no cervical adenopathy.  Neurological: She is alert and oriented to person, place, and time. She has normal reflexes. She exhibits normal muscle tone. Coordination normal.  Skin: Skin is warm and dry. She is not diaphoretic. No erythema. No pallor.  Psychiatric: She has a normal mood and affect. Her behavior is normal. Judgment and thought content normal.          Assessment & Plan:  HIV: continue Triumeq   Hypertension: continue  hctz well controlled  Obesity: will try to lose weight  Myalgias arthralgias: check cpk again, esr, crp ana RF. I spent greater than 25 minutes with the patient including greater than 50% of time in face to face counsel of the patient and in coordination of their care.   CV risk : 10 year FRS of 1% would be great REPRIEVE candidate will cc Herschell Dimes

## 2013-09-13 LAB — SJOGRENS SYNDROME-A EXTRACTABLE NUCLEAR ANTIBODY: SSA (Ro) (ENA) Antibody, IgG: <1

## 2013-09-13 LAB — ANA: Anti Nuclear Antibody(ANA): NEGATIVE

## 2013-09-13 LAB — CK: CK TOTAL: 130 U/L (ref 7–177)

## 2013-09-13 LAB — C-REACTIVE PROTEIN

## 2013-09-13 LAB — RHEUMATOID FACTOR

## 2013-09-13 LAB — SJOGRENS SYNDROME-B EXTRACTABLE NUCLEAR ANTIBODY: SSB (La) (ENA) Antibody, IgG: <1

## 2013-09-21 ENCOUNTER — Ambulatory Visit: Payer: Medicaid Other

## 2013-09-29 ENCOUNTER — Ambulatory Visit (INDEPENDENT_AMBULATORY_CARE_PROVIDER_SITE_OTHER): Payer: Self-pay | Admitting: *Deleted

## 2013-09-29 VITALS — BP 138/82 | HR 71 | Temp 98.6°F | Resp 16 | Ht 61.75 in | Wt 232.8 lb

## 2013-09-29 DIAGNOSIS — B20 Human immunodeficiency virus [HIV] disease: Secondary | ICD-10-CM

## 2013-09-29 DIAGNOSIS — Z21 Asymptomatic human immunodeficiency virus [HIV] infection status: Secondary | ICD-10-CM

## 2013-09-29 DIAGNOSIS — Z006 Encounter for examination for normal comparison and control in clinical research program: Secondary | ICD-10-CM

## 2013-09-29 NOTE — Progress Notes (Signed)
Teresa Doyle is here for screening for the Reprieve Study. We reviewed the consent together and answered any questions she had about the study. She was able to verbalize understanding of the protocol. Informed consent was obtained before any other procedures were done. 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 She c/o aching joints particularly her feet that got more pronounced last year.  She denies any other current problems. She had a hysterectomy in April of this year. She is scheduled to come back sept. 18th for study entry. I will give her a flushot that day after we draw blood.

## 2013-09-29 NOTE — Progress Notes (Signed)
   Subjective:    Patient ID: Teresa Doyle, female    DOB: Jul 09, 1965, 48 y.o.   MRN: [id number]  HPI    Review of Systems  Constitutional: Negative.   HENT: Negative.   Eyes: Negative.   Respiratory: Negative.   Cardiovascular: Negative.   Gastrointestinal: Negative.   Genitourinary: Negative.   Musculoskeletal: Positive for arthralgias.  Neurological: Negative.   Psychiatric/Behavioral: Negative.        Objective:   Physical Exam  Constitutional: She is oriented to person, place, and time.  HENT:  Mouth/Throat: Oropharynx is clear and moist.  Eyes: No scleral icterus.  Cardiovascular: Normal rate, regular rhythm, normal heart sounds and intact distal pulses.   Pulmonary/Chest: Effort normal and breath sounds normal.  Abdominal: Soft. Bowel sounds are normal.  Musculoskeletal: Normal range of motion.  Lymphadenopathy:    She has no cervical adenopathy.  Neurological: She is alert and oriented to person, place, and time.  Skin: Skin is warm and dry.  Psychiatric: She has a normal mood and affect.          Assessment & Plan:

## 2013-09-30 LAB — LIPID PANEL
CHOLESTEROL: 186 mg/dL (ref 0–200)
HDL: 92 mg/dL (ref >39)
LDL Cholesterol: 80 mg/dL (ref 0–99)
TRIGLYCERIDES: 68 mg/dL (ref <150)
Total CHOL/HDL Ratio: 2 Ratio
VLDL: 14 mg/dL (ref 0–40)

## 2013-10-18 ENCOUNTER — Other Ambulatory Visit: Payer: Self-pay | Admitting: *Deleted

## 2013-10-18 DIAGNOSIS — B20 Human immunodeficiency virus [HIV] disease: Secondary | ICD-10-CM

## 2013-10-18 MED ORDER — ABACAVIR-DOLUTEGRAVIR-LAMIVUD 600-50-300 MG PO TABS: 1.0000 | ORAL_TABLET | Freq: Every day | ORAL | Status: DC

## 2013-10-18 NOTE — Telephone Encounter (Signed)
ADAP Application 

## 2013-10-24 ENCOUNTER — Telehealth: Payer: Self-pay | Admitting: Infectious Disease

## 2013-10-24 NOTE — Telephone Encounter (Signed)
To clarify eligibility for REPRIEVE  Ms Winton has suffered from diffuse muscle aches but her muscle enzymes have been negative  She has not been diagnosed with myositis or a myopathy.  I have thought that some of her initial ssx could be due to IRIS +/- fibromyalgia  I feel she should be eligible for REPRIEVE

## 2013-10-28 ENCOUNTER — Ambulatory Visit (INDEPENDENT_AMBULATORY_CARE_PROVIDER_SITE_OTHER): Payer: Self-pay | Admitting: *Deleted

## 2013-10-28 VITALS — BP 130/84 | HR 64 | Temp 98.4°F | Resp 16 | Wt 238.2 lb

## 2013-10-28 DIAGNOSIS — B20 Human immunodeficiency virus [HIV] disease: Secondary | ICD-10-CM

## 2013-10-28 DIAGNOSIS — Z23 Encounter for immunization: Secondary | ICD-10-CM

## 2013-10-28 MED ORDER — PITAVASTATIN CALCIUM 4 MG PO TABS: 4.0000 mg | ORAL_TABLET | Freq: Every day | ORAL | Status: AC

## 2013-10-28 NOTE — Progress Notes (Signed)
Teresa Doyle was enrolled on the Reprieve study today. She was randomized to pitavastatin 4mg /placebo daily. She was informed of the side effects, dosing, adherence and to call for severe muscle aches, rash, etc. She denies any new problems at this visit and will return in 1 month.

## 2013-11-09 ENCOUNTER — Other Ambulatory Visit: Payer: Self-pay | Admitting: Licensed Clinical Social Worker

## 2013-11-09 DIAGNOSIS — I10 Essential (primary) hypertension: Secondary | ICD-10-CM

## 2013-11-09 MED ORDER — HYDROCHLOROTHIAZIDE 25 MG PO TABS: 25.0000 mg | ORAL_TABLET | Freq: Every day | ORAL | Status: DC

## 2013-11-28 ENCOUNTER — Ambulatory Visit (INDEPENDENT_AMBULATORY_CARE_PROVIDER_SITE_OTHER): Payer: Self-pay | Admitting: *Deleted

## 2013-11-28 VITALS — BP 150/88 | HR 62 | Temp 98.2°F | Resp 16 | Wt 236.5 lb

## 2013-11-28 DIAGNOSIS — Z006 Encounter for examination for normal comparison and control in clinical research program: Secondary | ICD-10-CM

## 2013-11-28 DIAGNOSIS — B20 Human immunodeficiency virus [HIV] disease: Secondary | ICD-10-CM

## 2013-11-28 LAB — COMPREHENSIVE METABOLIC PANEL
ALK PHOS: 54 U/L (ref 39–117)
ALT: 14 U/L (ref 0–35)
AST: 21 U/L (ref 0–37)
Albumin: 4.2 g/dL (ref 3.5–5.2)
BUN: 7 mg/dL (ref 6–23)
CO2: 26 meq/L (ref 19–32)
Calcium: 9.4 mg/dL (ref 8.4–10.5)
Chloride: 103 mEq/L (ref 96–112)
Creat: 0.69 mg/dL (ref 0.50–1.10)
Glucose, Bld: 83 mg/dL (ref 70–99)
Potassium: 4 mEq/L (ref 3.5–5.3)
SODIUM: 140 meq/L (ref 135–145)
TOTAL PROTEIN: 6.5 g/dL (ref 6.0–8.3)
Total Bilirubin: 0.5 mg/dL (ref 0.2–1.2)

## 2013-11-28 NOTE — Progress Notes (Signed)
Teresa Doyle is here for A5332, month 1. She denies any new problems, signs, or symptoms. She denies muscle aches or weakness. BP elevated. She stated she missed her BP meds this AM. Pill count was perform and she had 150 out of 180 pills remaining. She states she has not missed a dose of study meds. Non fasting blood was obtained. She received $50 gift card for study visit. Next appointment scheduled for March 01, 2014 @ 9am. Eliezer Champagne RN

## 2014-02-08 ENCOUNTER — Other Ambulatory Visit: Payer: Self-pay | Admitting: *Deleted

## 2014-02-08 DIAGNOSIS — B20 Human immunodeficiency virus [HIV] disease: Secondary | ICD-10-CM

## 2014-02-08 MED ORDER — ABACAVIR-DOLUTEGRAVIR-LAMIVUD 600-50-300 MG PO TABS: 1.0000 | ORAL_TABLET | Freq: Every day | ORAL | Status: DC

## 2014-02-28 ENCOUNTER — Other Ambulatory Visit (INDEPENDENT_AMBULATORY_CARE_PROVIDER_SITE_OTHER): Payer: Self-pay

## 2014-02-28 ENCOUNTER — Other Ambulatory Visit (HOSPITAL_COMMUNITY)
Admission: RE | Admit: 2014-02-28 | Discharge: 2014-02-28 | Disposition: A | Discharge status: Home / Self Care | Payer: Self-pay | Source: Ambulatory Visit | Attending: Internal Medicine | Admitting: Internal Medicine

## 2014-02-28 ENCOUNTER — Ambulatory Visit (INDEPENDENT_AMBULATORY_CARE_PROVIDER_SITE_OTHER): Payer: Self-pay | Admitting: *Deleted

## 2014-02-28 VITALS — BP 160/101 | HR 62 | Temp 98.4°F | Resp 14 | Wt 246.8 lb

## 2014-02-28 DIAGNOSIS — Z21 Asymptomatic human immunodeficiency virus [HIV] infection status: Secondary | ICD-10-CM

## 2014-02-28 DIAGNOSIS — Z79899 Other long term (current) drug therapy: Secondary | ICD-10-CM

## 2014-02-28 DIAGNOSIS — Z006 Encounter for examination for normal comparison and control in clinical research program: Secondary | ICD-10-CM

## 2014-02-28 DIAGNOSIS — Z113 Encounter for screening for infections with a predominantly sexual mode of transmission: Secondary | ICD-10-CM

## 2014-02-28 DIAGNOSIS — B20 Human immunodeficiency virus [HIV] disease: Secondary | ICD-10-CM

## 2014-02-28 LAB — COMPLETE METABOLIC PANEL WITH GFR
ALT: 9 U/L (ref 0–35)
AST: 15 U/L (ref 0–37)
Albumin: 3.6 g/dL (ref 3.5–5.2)
Alkaline Phosphatase: 57 U/L (ref 39–117)
BILIRUBIN TOTAL: 0.4 mg/dL (ref 0.2–1.2)
BUN: 8 mg/dL (ref 6–23)
CO2: 25 mEq/L (ref 19–32)
Calcium: 8.8 mg/dL (ref 8.4–10.5)
Chloride: 106 mEq/L (ref 96–112)
Creat: 0.7 mg/dL (ref 0.50–1.10)
GFR, Est Non African American: >89 mL/min
GLUCOSE: 82 mg/dL (ref 70–99)
Potassium: 4 mEq/L (ref 3.5–5.3)
Sodium: 140 mEq/L (ref 135–145)
TOTAL PROTEIN: 6.3 g/dL (ref 6.0–8.3)

## 2014-02-28 LAB — CBC WITH DIFFERENTIAL/PLATELET
Basophils Absolute: 0 10*3/uL (ref 0.0–0.1)
Basophils Relative: 0 % (ref 0–1)
EOS ABS: 0.2 10*3/uL (ref 0.0–0.7)
EOS PCT: 2 % (ref 0–5)
HEMATOCRIT: 35.1 % — AB (ref 36.0–46.0)
HEMOGLOBIN: 11.6 g/dL — AB (ref 12.0–15.0)
LYMPHS ABS: 2.7 10*3/uL (ref 0.7–4.0)
Lymphocytes Relative: 28 % (ref 12–46)
MCH: 24.4 pg — ABNORMAL LOW (ref 26.0–34.0)
MCHC: 33 g/dL (ref 30.0–36.0)
MCV: 73.9 fL — ABNORMAL LOW (ref 78.0–100.0)
MONO ABS: 0.5 10*3/uL (ref 0.1–1.0)
MPV: 11 fL (ref 8.6–12.4)
Monocytes Relative: 5 % (ref 3–12)
NEUTROS ABS: 6.2 10*3/uL (ref 1.7–7.7)
NEUTROS PCT: 65 % (ref 43–77)
PLATELETS: 253 10*3/uL (ref 150–400)
RBC: 4.75 MIL/uL (ref 3.87–5.11)
RDW: 16 % — AB (ref 11.5–15.5)
WBC: 9.5 10*3/uL (ref 4.0–10.5)

## 2014-02-28 LAB — LIPID PANEL
Cholesterol: 155 mg/dL (ref 0–200)
HDL: 74 mg/dL (ref >39)
LDL CALC: 63 mg/dL (ref 0–99)
TRIGLYCERIDES: 88 mg/dL (ref <150)
Total CHOL/HDL Ratio: 2.1 Ratio
VLDL: 18 mg/dL (ref 0–40)

## 2014-02-28 LAB — RPR

## 2014-03-01 ENCOUNTER — Other Ambulatory Visit: Payer: Medicaid Other

## 2014-03-01 LAB — MICROALBUMIN / CREATININE URINE RATIO
CREATININE, URINE: 297.8 mg/dL
MICROALB UR: 1.2 mg/dL (ref <2.0)
MICROALB/CREAT RATIO: 4 mg/g (ref 0.0–30.0)

## 2014-03-01 LAB — HIV-1 RNA QUANT-NO REFLEX-BLD: HIV-1 RNA Quant, Log: <1.3 {Log} (ref <1.30)

## 2014-03-01 LAB — T-HELPER CELL (CD4) - (RCID CLINIC ONLY)
CD4 % Helper T Cell: 27 % — ABNORMAL LOW (ref 33–55)
CD4 T Cell Abs: 630 /uL (ref 400–2700)

## 2014-03-01 LAB — URINE CYTOLOGY ANCILLARY ONLY
Chlamydia: NEGATIVE
Neisseria Gonorrhea: NEGATIVE

## 2014-03-01 NOTE — Progress Notes (Signed)
Teresa Doyle was here for her 4 month Reprieve study visit. She recently got over an URI and is getting here regular clinic labs today. She will return in 4 months for the next visit.

## 2014-03-29 ENCOUNTER — Ambulatory Visit (INDEPENDENT_AMBULATORY_CARE_PROVIDER_SITE_OTHER): Payer: Self-pay | Admitting: Infectious Disease

## 2014-03-29 ENCOUNTER — Encounter: Payer: Self-pay | Admitting: Infectious Disease

## 2014-03-29 VITALS — BP 135/84 | HR 76 | Wt 243.0 lb

## 2014-03-29 DIAGNOSIS — M609 Myositis, unspecified: Secondary | ICD-10-CM

## 2014-03-29 DIAGNOSIS — B20 Human immunodeficiency virus [HIV] disease: Secondary | ICD-10-CM

## 2014-03-29 DIAGNOSIS — I10 Essential (primary) hypertension: Secondary | ICD-10-CM

## 2014-03-29 DIAGNOSIS — M791 Myalgia: Secondary | ICD-10-CM

## 2014-03-29 DIAGNOSIS — IMO0001 Reserved for inherently not codable concepts without codable children: Secondary | ICD-10-CM | POA: Insufficient documentation

## 2014-03-29 DIAGNOSIS — E669 Obesity, unspecified: Secondary | ICD-10-CM

## 2014-03-29 NOTE — Progress Notes (Signed)
  Subjective:    Patient ID: Teresa Doyle, female    DOB: 1965-11-08, 49 y.o.   MRN: [id number]  HPI   49 year old [place] American lady with HIV/AIDS with PCP pneumonia currently doing perfectly on [place],  Lab Results  Component Value Date   HIV1RNAQUANT <20 02/28/2014   Lab Results  Component Value Date   CD4TABS 630 02/28/2014   CD4TABS 530 08/10/2013   CD4TABS 440 03/21/2013    She continues to sufferfrom some diffuse myalgias of unclear cause but is tolerating this.   She is enrolled in REPRIEVE and has not noitced any worsening of her muscle pains.   Review of Systems  Constitutional: Negative for fever, chills, diaphoresis, activity change, appetite change, fatigue and unexpected weight change.  HENT: Negative for congestion, rhinorrhea, sinus pressure, sneezing, sore throat and trouble swallowing.   Eyes: Negative for photophobia and visual disturbance.  Respiratory: Negative for cough, chest tightness, shortness of breath, wheezing and stridor.   Cardiovascular: Negative for chest pain, palpitations and leg swelling.  Gastrointestinal: Negative for nausea, vomiting, abdominal pain, diarrhea, constipation, blood in stool, abdominal distention and anal bleeding.  Genitourinary: Negative for dysuria, hematuria, flank pain and difficulty urinating.  Musculoskeletal: Positive for myalgias. Negative for back pain, joint swelling, arthralgias and gait problem.  Skin: Negative for color change, pallor, rash and wound.  Neurological: Negative for dizziness, tremors, weakness and light-headedness.  Hematological: Negative for adenopathy. Does not bruise/bleed easily.  Psychiatric/Behavioral: Negative for behavioral problems, confusion, sleep disturbance, dysphoric mood, decreased concentration and agitation.       Objective:   Physical Exam  Constitutional: She is oriented to person, place, and time. She appears well-developed and well-nourished. No distress.  HENT:   Head: Normocephalic and atraumatic.  Mouth/Throat: Oropharynx is clear and moist. No oropharyngeal exudate.  Eyes: Conjunctivae and EOM are normal. Pupils are equal, round, and reactive to light. No scleral icterus.  Neck: Normal range of motion. Neck supple. No JVD present.  Cardiovascular: Normal rate, regular rhythm and normal heart sounds.  Exam reveals no gallop and no friction rub.   No murmur heard. Pulmonary/Chest: Effort normal and breath sounds normal. No respiratory distress. She has no wheezes. She has no rales. She exhibits no tenderness.  Abdominal: She exhibits no distension and no mass. There is no tenderness. There is no rebound and no guarding.  Musculoskeletal: She exhibits no edema or tenderness.  Lymphadenopathy:    She has no cervical adenopathy.  Neurological: She is alert and oriented to person, place, and time. She has normal reflexes. She exhibits normal muscle tone. Coordination normal.  Skin: Skin is warm and dry. She is not diaphoretic. No erythema. No pallor.  Psychiatric: She has a normal mood and affect. Her behavior is normal. Judgment and thought content normal.          Assessment & Plan:  HIV: continue Triumeq   Hypertension: continue  hctz well controlled  Obesity: will try to lose weight. Advised low carb diet, exercise perhaps yoga for starters  Myalgias arthralgias: I worked this up as best I could.

## 2014-04-11 ENCOUNTER — Other Ambulatory Visit: Payer: Self-pay | Admitting: Licensed Clinical Social Worker

## 2014-04-11 DIAGNOSIS — B20 Human immunodeficiency virus [HIV] disease: Secondary | ICD-10-CM

## 2014-04-11 MED ORDER — ABACAVIR-DOLUTEGRAVIR-LAMIVUD 600-50-300 MG PO TABS: 1.0000 | ORAL_TABLET | Freq: Every day | ORAL | Status: DC

## 2014-04-29 LAB — HM MAMMOGRAPHY

## 2014-05-02 ENCOUNTER — Telehealth: Payer: Self-pay | Admitting: Medical

## 2014-05-02 ENCOUNTER — Encounter: Payer: Self-pay | Admitting: Internal Medicine

## 2014-05-02 NOTE — Telephone Encounter (Signed)
I left the patient a detailed message on her voicemail

## 2014-05-02 NOTE — Telephone Encounter (Signed)
Mammogram normal, but set her up for CPX here.  Last visit here 2014

## 2014-06-22 ENCOUNTER — Encounter (INDEPENDENT_AMBULATORY_CARE_PROVIDER_SITE_OTHER): Payer: Self-pay | Admitting: *Deleted

## 2014-06-22 ENCOUNTER — Encounter: Payer: Self-pay | Admitting: *Deleted

## 2014-06-22 VITALS — BP 134/90 | HR 66 | Temp 98.1°F | Resp 16 | Wt 249.0 lb

## 2014-06-22 DIAGNOSIS — Z006 Encounter for examination for normal comparison and control in clinical research program: Secondary | ICD-10-CM

## 2014-06-22 NOTE — Progress Notes (Signed)
Teresa Doyle is here for A5332 month 8 visit. We reviewed the updated informed consent together. I fully explained the consent, risks, benefits, responsibilities, and answered questions. Participant verbalized understanding and signed the consent witnessed by me. I gave a copy of the signed consent to participant. She has slowly increased her weight due to overeating during the holidays and has not felt like physically moving around as much. She states that her myalgias have increased a bit but she feels this is because of her weight gain. She states that she has dull achy pains in her hands bilat, sharp shooting pains in tops of feet bilat that feel as if they are coming from the bone area. Left shoulder ache and Left knee ache; stiffness in joints and overall fatigue. She takes aleve PRN for these above symptoms. Vital signs performed. Pill count performed with 36 left. Medications were dispensed. She received $20 gift card for visit. Next appointment is Sept 12, 2016 @ 8:30am. Eliezer Champagne RN

## 2014-07-12 ENCOUNTER — Encounter: Payer: Medicaid Other | Admitting: Medical

## 2014-07-13 ENCOUNTER — Encounter: Payer: Medicaid Other | Admitting: Medical

## 2014-08-03 ENCOUNTER — Encounter: Payer: Self-pay | Admitting: Medical

## 2014-08-03 ENCOUNTER — Ambulatory Visit (INDEPENDENT_AMBULATORY_CARE_PROVIDER_SITE_OTHER): Payer: Medicaid Other | Admitting: Medical

## 2014-08-03 VITALS — BP 148/80 | HR 72 | Temp 98.2°F | Resp 15 | Ht 62.0 in | Wt 250.0 lb

## 2014-08-03 DIAGNOSIS — E049 Nontoxic goiter, unspecified: Secondary | ICD-10-CM | POA: Diagnosis not present

## 2014-08-03 DIAGNOSIS — E669 Obesity, unspecified: Secondary | ICD-10-CM

## 2014-08-03 DIAGNOSIS — N9489 Other specified conditions associated with female genital organs and menstrual cycle: Secondary | ICD-10-CM | POA: Diagnosis not present

## 2014-08-03 DIAGNOSIS — F411 Generalized anxiety disorder: Secondary | ICD-10-CM

## 2014-08-03 DIAGNOSIS — Z0000 Encounter for general adult medical examination without abnormal findings: Secondary | ICD-10-CM

## 2014-08-03 DIAGNOSIS — Z113 Encounter for screening for infections with a predominantly sexual mode of transmission: Secondary | ICD-10-CM

## 2014-08-03 DIAGNOSIS — R87618 Other abnormal cytological findings on specimens from cervix uteri: Secondary | ICD-10-CM | POA: Diagnosis not present

## 2014-08-03 DIAGNOSIS — A6000 Herpesviral infection of urogenital system, unspecified: Secondary | ICD-10-CM | POA: Diagnosis not present

## 2014-08-03 DIAGNOSIS — I10 Essential (primary) hypertension: Secondary | ICD-10-CM

## 2014-08-03 DIAGNOSIS — R32 Unspecified urinary incontinence: Secondary | ICD-10-CM

## 2014-08-03 DIAGNOSIS — M722 Plantar fascial fibromatosis: Secondary | ICD-10-CM

## 2014-08-03 DIAGNOSIS — L732 Hidradenitis suppurativa: Secondary | ICD-10-CM

## 2014-08-03 DIAGNOSIS — B20 Human immunodeficiency virus [HIV] disease: Secondary | ICD-10-CM

## 2014-08-03 DIAGNOSIS — N898 Other specified noninflammatory disorders of vagina: Secondary | ICD-10-CM

## 2014-08-03 LAB — POCT WET PREP (WET MOUNT)
Clue Cells Wet Prep Whiff POC: NEGATIVE
KOH Wet Prep POC: NEGATIVE
Trichomonas Wet Prep HPF POC: NEGATIVE
WBC WET PREP: NEGATIVE

## 2014-08-03 MED ORDER — TOLTERODINE TARTRATE ER 4 MG PO CP24: 4.0000 mg | ORAL_CAPSULE | Freq: Every day | ORAL | Status: DC

## 2014-08-03 MED ORDER — FLUCONAZOLE 150 MG PO TABS: ORAL_TABLET | ORAL | Status: DC

## 2014-08-03 NOTE — Progress Notes (Signed)
Subjective:   HPI  Teresa Doyle is a 49 y.o. female who presents for a complete physical.  Medical care team includes:  Dr. Tommy Medal for infectious disease HIV therapy Dorothea Ogle, PA-C here for primary care, but last visit 2 years ago Sees eye doctor Hasn't seen dentist in a while   Preventative care: Last colonoscopy: sigmoidoscopy in past. Last mammogram: 04/2014 Last labs: 02/2014  Prior vaccinations: TD or Tdap: unsure Influenza: gets yearly Pneumococcal: has had prior Shingles vaccine - never   Concerns: Still gets frequent myalgias, mostly in legs.   Has foot pains daily, soles of feet , heels, even from time she gets out of bed.  Compliant with BP and cholesterol medication.  Checks BPs at work, gets 130/80s typically.  Having some vaginal odor, wants testing.   No vaginal discharge.  No current sexual partners.   Last STD testing was probably here.  Is now s/p hysterectomy.    Reviewed their medical, surgical, family, social, medication, and allergy history and updated chart as appropriate.  Past Medical History  Diagnosis Date  . Obesity   . Hypertension   . Fibroids uterine  . Suppurative hidradenitis     w/ prior multiple abscess, MRSA +  . Sinus problem     twice yearly   . Anemia     iron deficient, since teenage years  . Nearsightedness     wears glasses  . Genital HSV   . Urinary incontinence   . Normal labor and delivery     5/92 and 8/95  . HIV disease 11/2011    sees Infectious Disease Clinic  . H/O: hysterectomy 05/2013    due to fibroids and abnormal pap    Past Surgical History  Procedure Laterality Date  . Myomectomy  2005  . Bilateral tubal ligation    . Wisdom tooth extraction    . Flexible sigmoidoscopy  2005    due to abdominal pain; normal  . Laparoscopic hysterectomy  05/2013    due to fibroids and abnormal pap  . Bilateral salpingectomy  05/2013  . Ovarian cyst removal  05/2013    History   Social History  . Marital  Status: Divorced    Spouse Name: N/A  . Number of Children: 2  . 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 Smoking status: Never Smoker   . Smokeless tobacco: Never Used  . Alcohol Use: No     Comment: rare  . Drug Use: No  . Sexual Activity: Not on file   Other Topics Concern  . Not on file   Social History Narrative   Lives with her 2 children (1 daughter, 1 son), dog; single, exercise - walking, sometimes treadmill with fitness center at work.   Is a nurse, travel nursing, wellness coach    Family History  Problem Relation Age of Onset  . Diabetes Neg Hx   . Stroke Neg Hx   . Cancer Neg Hx   . Heart disease Neg Hx   . Hypertension Neg Hx   . Hyperlipidemia Mother      Current outpatient prescriptions:  .  Abacavir-Dolutegravir-Lamivud (TRIUMEQ) 600-50-300 MG TABS, Take 1 tablet by mouth daily., Disp: 30 tablet, Rfl: 5 .  fluconazole (DIFLUCAN) 100 MG tablet, Take 1 tablet (100 mg total) by mouth daily., Disp: 14 tablet, Rfl: 3 .  hydrochlorothiazide (HYDRODIURIL) 25  MG tablet, Take 1 tablet (25 mg total) by mouth daily., Disp: 30 tablet, Rfl: 11 .  Pitavastatin Calcium 4 MG TABS, Take 1 tablet (4 mg total) by mouth daily., Disp: 30 tablet, Rfl:  .  valACYclovir (VALTREX) 1000 MG tablet, Take 1 tablet (1,000 mg total) by mouth 2 (two) times daily as needed. For outbreak, Disp: 30 tablet, Rfl: 3 .  fluconazole (DIFLUCAN) 150 MG tablet, One tablet now, may repeat in 1 week, Disp: 2 tablet, Rfl: 1 .  tolterodine (DETROL LA) 4 MG 24 hr capsule, Take 1 capsule (4 mg total) by mouth daily., Disp: 30 capsule, Rfl: 3  No Known Allergies   Review of Systems Constitutional: -fever, -chills, -sweats, -unexpected weight change, -decreased appetite, -fatigue Allergy: -sneezing, -itching, -congestion Dermatology: -changing moles, --rash, -lumps ENT: -runny nose, -ear pain, -sore throat, -hoarseness,  -sinus pain, -teeth pain, - ringing in ears, -hearing loss, -nosebleeds Cardiology: -chest pain, -palpitations, -swelling, -difficulty breathing when lying flat, -waking up short of breath Respiratory: -cough, -shortness of breath, -difficulty breathing with exercise or exertion, -wheezing, -coughing up blood Gastroenterology: -abdominal pain, -nausea, -vomiting, -diarrhea, -constipation, -blood in stool, -changes in bowel movement, -difficulty swallowing or eating Hematology: -bleeding, -bruising  Musculoskeletal: +joint aches, +muscle aches, -joint swelling, -back pain, -neck pain, -cramping, -changes in gait, +pain in soles of feet Ophthalmology: denies vision changes, eye redness, itching, discharge Urology: -burning with urination, -difficulty urinating, -blood in urine, -urinary frequency, -urgency, -incontinence Neurology: -headache, -weakness, -tingling, -numbness, -memory loss, -falls, -dizziness Psychology: -depressed mood, -agitation, -sleep problems     Objective:   Physical Exam BP 148/80 mmHg  Pulse 72  Temp(Src) 98.2 F (36.8 C) (Oral)  Resp 15  Ht 5\' 2"  (1.575 m)  Wt 250 lb (113.399 kg)  BMI 45.71 kg/m2  LMP 03/03/2013  BP Readings from Last 3 Encounters:  08/03/14 148/80  06/22/14 134/90  03/29/14 135/84    General appearance: alert, no distress, WD/WN, AA female Skin: cheeks bilat with increased brown patchiness, scattered macules, no worrisome lesions HEENT: normocephalic, conjunctiva/corneas normal, sclerae anicteric, PERRLA, EOMi, nares patent, no discharge or erythema, pharynx normal Oral cavity: MMM, tongue normal, teeth in good repair Neck: supple, no lymphadenopathy, no thyromegaly, no masses, normal ROM, no bruits Chest: non tender, normal shape and expansion Heart: RRR, normal S1, S2, no murmurs Lungs: CTA bilaterally, no wheezes, rhonchi, or rales Abdomen: +bs, soft, non tender, non distended, no masses, no hepatomegaly, no splenomegaly, no  bruits Back: non tender, normal ROM, no scoliosis Musculoskeletal: mild tenderness of bilat heels, otherwise upper extremities non tender, no obvious deformity, normal ROM throughout, lower extremities non tender, no obvious deformity, normal ROM throughout Extremities: no edema, no cyanosis, no clubbing Pulses: 2+ symmetric, upper and lower extremities, normal cap refill Neurological: alert, oriented x 3, CN2-12 intact, strength normal upper extremities and lower extremities, sensation normal throughout, DTRs 2+ throughout, no cerebellar signs, gait normal Psychiatric: normal affect, behavior normal, pleasant  Breast: nontender, no masses or lumps, no skin changes, no nipple discharge or inversion, no axillary lymphadenopathy Gyn: Normal external genitalia without lesions, there seems to be slight bulge of the urethra and bladder suggesting mild cystocele, vagina with normal mucosa, s/p hysterectomy, slight white abnormal vaginal discharge. adnexa not enlarged, nontender, no masses. swabs taken. Exam chaperoned by nurse. Rectal:  deferred  Assessment and Plan :    Encounter Diagnoses  Name Primary?  . Encounter for health maintenance examination in adult Yes  . Anxiety state   . AIDS   .  Essential hypertension, benign   . Goiter   . Obesity   . Suppurative hidradenitis   . Genital HSV   . Other abnormal cytological finding of specimen from cervix   . Plantar fasciitis   . Vaginal odor   . Screen for STD (sexually transmitted disease)   . Urinary incontinence, unspecified incontinence type    Physical exam - discussed healthy lifestyle, diet, exercise, preventative care, vaccinations, and addressed their concerns.  Handout given. Reviewed chart history HTN - home readings 130/80s.  C/t same medication which she gets through hospital study program, reviewed labs from 02/2014 Hyperlipidemia - c/t same medication, on study medication, labs from 02/2014 reviewed Goiter - she will check  insurance coverage for ultrasound, will check with the program she is in about having thyroid labs done Obesity - work on efforts to lose weight through healthy diet and exercise.  HIV/AIDS - sees infectious disease regularly, reviewed recent records Plantar fascitis - discussed diagnosis, treatment recommendations, home remedies and avoiding sandals and other non supportive shoes Vaginal odor - begin diflucan for yeasts Begin daily Detrol for incontinence, f/u in 2 wk Screen for STD today. Follow-up pending labs

## 2014-08-03 NOTE — Progress Notes (Deleted)
Subjective:   HPI  Teresa Doyle is a 49 y.o. female who presents for a complete physical.  Medical care team includes: Infectious disease Dr Tommy Medal   Preventative care:here Last ophthalmology visit:10/16 [facility]  Last dental visit:2013 Dr Gerlene Burdock Last colonoscopy:never Last mammogram:2016 Last gynecological exam:2015 here Last WYO:VZCHYIF Last labs:here  Prior vaccinations: TD or Tdap:current Influenza: Pneumococcal: Shingles/Zostavax: Other:   Advanced directive: Health care power of attorney: Living will:  Concerns:   Reviewed their medical, surgical, family, social, medication, and allergy history and updated chart as appropriate.    Review of Systems Constitutional: -fever, -chills, -sweats, -unexpected weight change, -decreased appetite, -fatigue Allergy: -sneezing, -itching, -congestion Dermatology: -changing moles, --rash, -lumps ENT: -runny nose, -ear pain, -sore throat, -hoarseness, -sinus pain, -teeth pain, - ringing in ears, -hearing loss, -nosebleeds Cardiology: -chest pain, -palpitations, -swelling, -difficulty breathing when lying flat, -waking up short of breath Respiratory: +cough, -shortness of breath, -difficulty breathing with exercise or exertion, +wheezing, -coughing up blood Gastroenterology: -abdominal pain, -nausea, -vomiting, -diarrhea, -constipation, -blood in stool, -changes in bowel movement, -difficulty swallowing or eating Hematology: -bleeding, -bruising  Musculoskeletal: +joint aches, +muscle aches, -joint swelling, -back pain, -neck pain, -cramping, -changes in gait Ophthalmology: denies vision changes, eye redness, itching, discharge Urology: -burning with urination, -difficulty urinating, -blood in urine, -urinary frequency, -urgency, +incontinence Neurology: -headache, -weakness, -tingling, -numbness, -memory loss, -falls, -dizziness Psychology: -depressed mood, -agitation, -sleep problems     Objective:   Physical  Exam  Filed Vitals:   08/03/14 1024  BP: 148/80  Pulse: 72  Temp: 98.2 F (36.8 C)  Resp: 15    General appearance: alert, no distress, WD/WN,  Skin:  HEENT: normocephalic, conjunctiva/corneas normal, sclerae anicteric, PERRLA, EOMi, nares patent, no discharge or erythema, pharynx normal Oral cavity: MMM, tongue normal, teeth normal Neck: supple, no lymphadenopathy, no thyromegaly, no masses, normal ROM Chest: non tender, normal shape and expansion Heart: RRR, normal S1, S2, no murmurs Lungs: CTA bilaterally, no wheezes, rhonchi, or rales Abdomen: +bs, soft, non tender, non distended, no masses, no hepatomegaly, no splenomegaly, no bruits Back: non tender, normal ROM, no scoliosis Musculoskeletal: upper extremities non tender, no obvious deformity, normal ROM throughout, lower extremities non tender, no obvious deformity, normal ROM throughout Extremities: no edema, no cyanosis, no clubbing Pulses: 2+ symmetric, upper and lower extremities, normal cap refill Neurological: alert, oriented x 3, CN2-12 intact, strength normal upper extremities and lower extremities, sensation normal throughout, DTRs 2+ throughout, no cerebellar signs, gait normal Psychiatric: normal affect, behavior normal, pleasant  Breast: nontender, no masses or lumps, no skin changes, no nipple discharge or inversion, no axillary lymphadenopathy Gyn: Normal external genitalia without lesions, vagina with normal mucosa, cervix without lesions, no cervical motion tenderness, no abnormal vaginal discharge.  Uterus and adnexa not enlarged, nontender, no masses.  Pap performed.  Exam chaperoned by nurse. Rectal:     Assessment and Plan :       Physical exam - discussed healthy lifestyle, diet, exercise, preventative care, vaccinations, and addressed their concerns.  Handout given.    Follow-up

## 2014-08-04 LAB — GC/CHLAMYDIA PROBE AMP
CT PROBE, AMP APTIMA: NEGATIVE
GC PROBE AMP APTIMA: NEGATIVE

## 2014-08-04 LAB — RPR

## 2014-08-18 ENCOUNTER — Other Ambulatory Visit: Payer: Self-pay | Admitting: Infectious Disease

## 2014-08-25 ENCOUNTER — Encounter: Payer: Self-pay | Admitting: Infectious Disease

## 2014-09-11 ENCOUNTER — Ambulatory Visit: Payer: Medicaid Other

## 2014-09-13 ENCOUNTER — Encounter: Payer: Self-pay | Admitting: Medical

## 2014-09-17 IMAGING — CR DG CHEST 2V
2 series · 2 of 2 positions shown · non-contrast
Comparison: None.

CLINICAL DATA: Chest pain, cough, body aches, shortness of breath
and fever.

CHEST - 2 VIEW

[w chest pa]
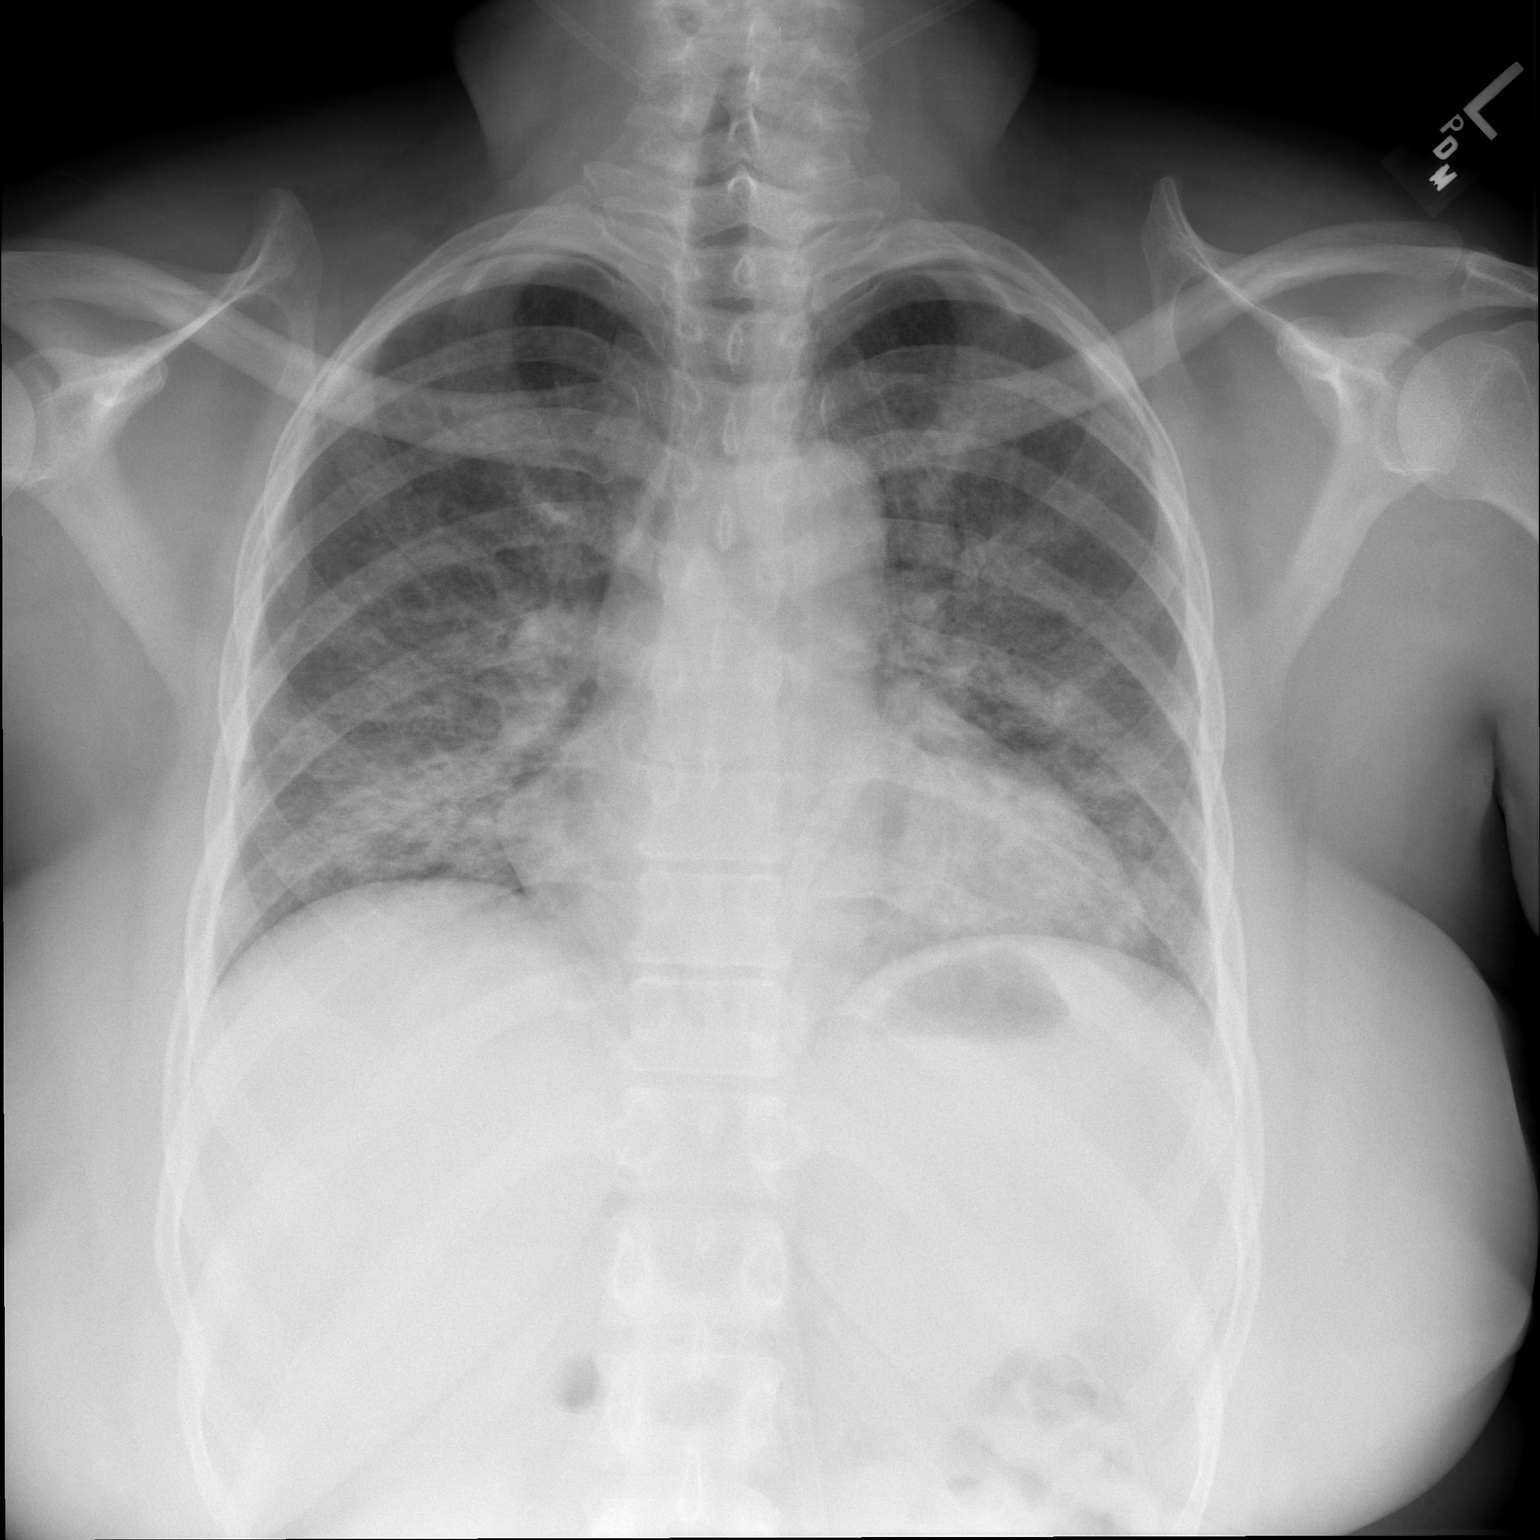

[w chest lat]
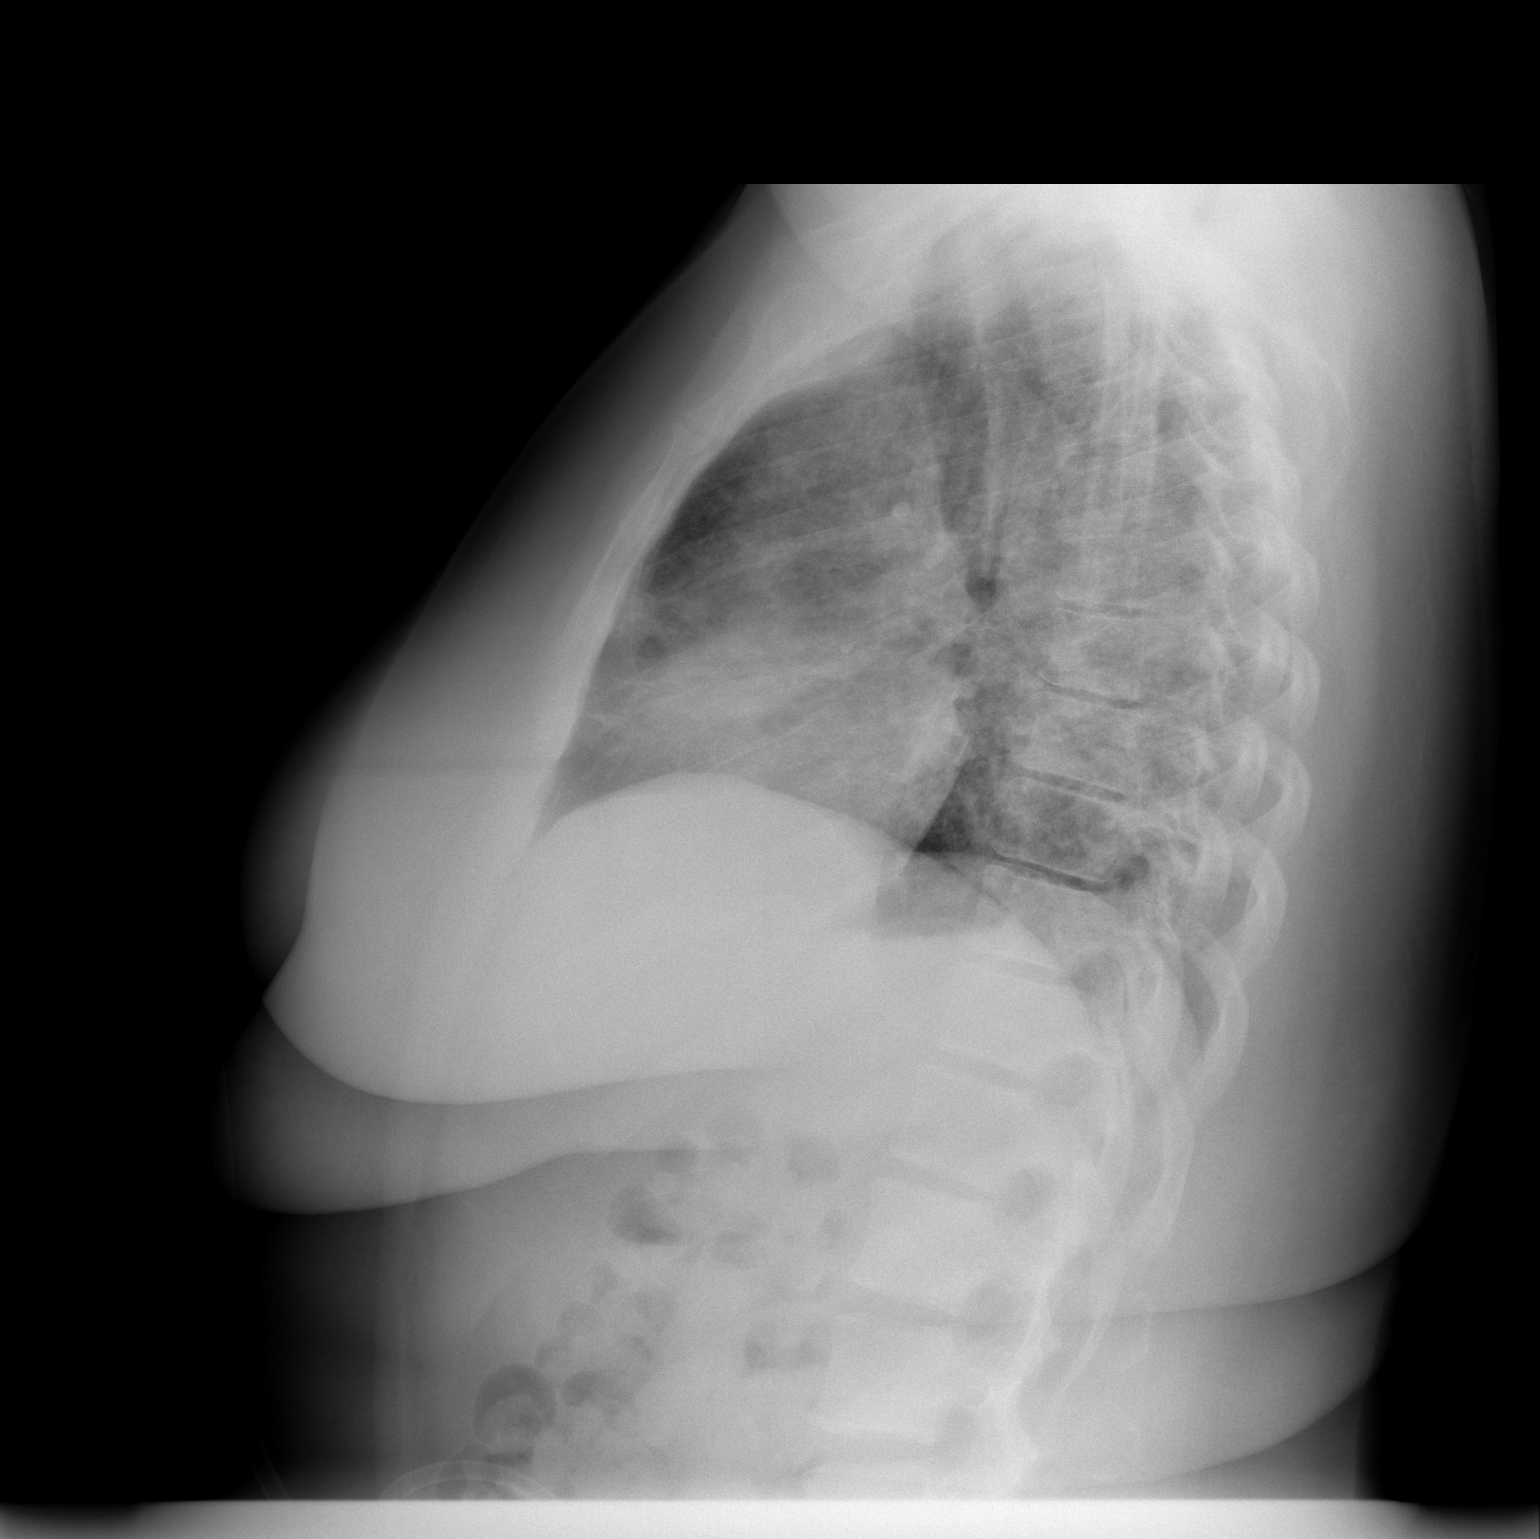

[2 of 2 positions shown; findings below may reference images not displayed]

FINDINGS: Trachea is midline.  Heart is at the upper limits normal
in size and accentuated by low lung volumes.  Mild diffuse
bilateral air space disease.  No pleural fluid.
IMPRESSION: Mild diffuse bilateral air space disease is suspicious for viral or
atypical pneumonia.  Edema is not excluded.

## 2014-09-18 ENCOUNTER — Other Ambulatory Visit: Payer: Self-pay | Admitting: Infectious Disease

## 2014-09-18 ENCOUNTER — Ambulatory Visit: Payer: Medicaid Other

## 2014-10-12 ENCOUNTER — Other Ambulatory Visit: Payer: Self-pay | Admitting: Infectious Disease

## 2014-10-12 DIAGNOSIS — B20 Human immunodeficiency virus [HIV] disease: Secondary | ICD-10-CM

## 2014-10-20 ENCOUNTER — Encounter (INDEPENDENT_AMBULATORY_CARE_PROVIDER_SITE_OTHER): Payer: Self-pay | Admitting: *Deleted

## 2014-10-20 ENCOUNTER — Other Ambulatory Visit (HOSPITAL_COMMUNITY)
Admission: RE | Admit: 2014-10-20 | Discharge: 2014-10-20 | Disposition: A | Discharge status: Home / Self Care | Payer: Self-pay | Source: Ambulatory Visit | Attending: Infectious Disease | Admitting: Infectious Disease

## 2014-10-20 VITALS — BP 138/95 | HR 58 | Temp 98.3°F | Resp 16 | Wt 248.8 lb

## 2014-10-20 DIAGNOSIS — R5382 Chronic fatigue, unspecified: Secondary | ICD-10-CM

## 2014-10-20 DIAGNOSIS — B20 Human immunodeficiency virus [HIV] disease: Secondary | ICD-10-CM

## 2014-10-20 DIAGNOSIS — Z113 Encounter for screening for infections with a predominantly sexual mode of transmission: Secondary | ICD-10-CM | POA: Insufficient documentation

## 2014-10-20 DIAGNOSIS — Z006 Encounter for examination for normal comparison and control in clinical research program: Secondary | ICD-10-CM

## 2014-10-20 DIAGNOSIS — Z23 Encounter for immunization: Secondary | ICD-10-CM

## 2014-10-20 LAB — CBC WITH DIFFERENTIAL/PLATELET
BASOS PCT: 1 % (ref 0–1)
Basophils Absolute: 0.1 10*3/uL (ref 0.0–0.1)
EOS ABS: 0.2 10*3/uL (ref 0.0–0.7)
EOS PCT: 3 % (ref 0–5)
HEMATOCRIT: 37.9 % (ref 36.0–46.0)
Hemoglobin: 12.1 g/dL (ref 12.0–15.0)
Lymphocytes Relative: 35 % (ref 12–46)
Lymphs Abs: 2.6 10*3/uL (ref 0.7–4.0)
MCH: 23.6 pg — ABNORMAL LOW (ref 26.0–34.0)
MCHC: 31.9 g/dL (ref 30.0–36.0)
MCV: 73.9 fL — ABNORMAL LOW (ref 78.0–100.0)
MONO ABS: 0.5 10*3/uL (ref 0.1–1.0)
MPV: 10.8 fL (ref 8.6–12.4)
Monocytes Relative: 7 % (ref 3–12)
Neutro Abs: 4.1 10*3/uL (ref 1.7–7.7)
Neutrophils Relative %: 54 % (ref 43–77)
Platelets: 252 10*3/uL (ref 150–400)
RBC: 5.13 MIL/uL — AB (ref 3.87–5.11)
RDW: 16.6 % — AB (ref 11.5–15.5)
WBC: 7.5 10*3/uL (ref 4.0–10.5)

## 2014-10-20 LAB — COMPLETE METABOLIC PANEL WITH GFR
ALT: 12 U/L (ref 6–29)
AST: 17 U/L (ref 10–35)
Albumin: 3.7 g/dL (ref 3.6–5.1)
Alkaline Phosphatase: 69 U/L (ref 33–115)
BUN: 14 mg/dL (ref 7–25)
CALCIUM: 8.7 mg/dL (ref 8.6–10.2)
CHLORIDE: 108 mmol/L (ref 98–110)
CO2: 25 mmol/L (ref 20–31)
Creat: 0.8 mg/dL (ref 0.50–1.10)
GFR, Est African American: >89 mL/min (ref >=60)
GFR, Est Non African American: 87 mL/min (ref >=60)
Glucose, Bld: 88 mg/dL (ref 65–99)
POTASSIUM: 3.9 mmol/L (ref 3.5–5.3)
SODIUM: 143 mmol/L (ref 135–146)
Total Bilirubin: 0.4 mg/dL (ref 0.2–1.2)
Total Protein: 6.1 g/dL (ref 6.1–8.1)

## 2014-10-20 LAB — LIPID PANEL
CHOL/HDL RATIO: 1.7 ratio (ref <=5.0)
CHOLESTEROL: 142 mg/dL (ref 125–200)
HDL: 85 mg/dL (ref >=46)
LDL Cholesterol: 46 mg/dL (ref <130)
TRIGLYCERIDES: 54 mg/dL (ref <150)
VLDL: 11 mg/dL (ref <30)

## 2014-10-20 LAB — MICROALBUMIN / CREATININE URINE RATIO
Creatinine, Urine: 254.9 mg/dL
MICROALB UR: 1.2 mg/dL (ref <2.0)
Microalb Creat Ratio: 4.7 mg/g (ref 0.0–30.0)

## 2014-10-20 LAB — TSH: TSH: 0.717 u[IU]/mL (ref 0.350–4.500)

## 2014-10-20 LAB — T-HELPER CELL (CD4) - (RCID CLINIC ONLY)
CD4 % Helper T Cell: 29 % — ABNORMAL LOW (ref 33–55)
CD4 T Cell Abs: 740 /uL (ref 400–2700)

## 2014-10-20 LAB — RPR

## 2014-10-20 LAB — VITAMIN B12: Vitamin B-12: 505 pg/mL (ref 211–911)

## 2014-10-20 NOTE — Progress Notes (Signed)
Teresa Doyle is here for her month 12 Reprieve visit, A Randomized Trial to Prevent Vascular Events in HIV (study drug is Pitavastatin 4mg  or placebo). She is complaining of significant fatigue and just feeling exhausted. She wanted to see if we could check some labs on her to include Vitamin D, B-12 and a tsh level, which I added to the other clinic labs drawn today. She will see Dr. Tommy Medal in a few weeks for her regular scheduled followup. She says she has been very adherent with her meds, pill counts are off a little, but not significant. She continues to have joint and muscle aches and now has plantar fasciitis in both feet which is the most bothersome. She takes Aleve to help with that. She will return for study in January.

## 2014-10-20 NOTE — Addendum Note (Signed)
Addended by: Dolan Amen D on: 10/20/2014 11:08 AM   Modules accepted: Orders

## 2014-10-21 LAB — VITAMIN D 25 HYDROXY (VIT D DEFICIENCY, FRACTURES): Vit D, 25-Hydroxy: 34 ng/mL (ref 30–100)

## 2014-10-23 ENCOUNTER — Other Ambulatory Visit: Payer: Medicaid Other

## 2014-10-23 LAB — URINE CYTOLOGY ANCILLARY ONLY
CHLAMYDIA, DNA PROBE: NEGATIVE
Neisseria Gonorrhea: NEGATIVE

## 2014-10-24 LAB — HIV-1 RNA QUANT-NO REFLEX-BLD: HIV-1 RNA Quant, Log: <1.3 {Log} (ref <1.30)

## 2014-11-06 ENCOUNTER — Ambulatory Visit (INDEPENDENT_AMBULATORY_CARE_PROVIDER_SITE_OTHER): Payer: Self-pay | Admitting: Infectious Disease

## 2014-11-06 ENCOUNTER — Encounter: Payer: Self-pay | Admitting: Infectious Disease

## 2014-11-06 VITALS — BP 130/80 | HR 64 | Wt 247.0 lb

## 2014-11-06 DIAGNOSIS — F411 Generalized anxiety disorder: Secondary | ICD-10-CM

## 2014-11-06 DIAGNOSIS — R5382 Chronic fatigue, unspecified: Secondary | ICD-10-CM

## 2014-11-06 DIAGNOSIS — E049 Nontoxic goiter, unspecified: Secondary | ICD-10-CM

## 2014-11-06 DIAGNOSIS — M791 Myalgia: Secondary | ICD-10-CM

## 2014-11-06 DIAGNOSIS — E669 Obesity, unspecified: Secondary | ICD-10-CM

## 2014-11-06 DIAGNOSIS — IMO0001 Reserved for inherently not codable concepts without codable children: Secondary | ICD-10-CM

## 2014-11-06 DIAGNOSIS — M609 Myositis, unspecified: Secondary | ICD-10-CM

## 2014-11-06 DIAGNOSIS — B20 Human immunodeficiency virus [HIV] disease: Secondary | ICD-10-CM

## 2014-11-06 NOTE — Progress Notes (Signed)
Chief complaint: fatigue, myalgias, weight gain Subjective:    Patient ID: Teresa Doyle, female    DOB: 1966-01-08, 49 y.o.   MRN: [id number]  HPI   49 year old African American lady with HIV/AIDS with PCP pneumonia currently with perfect virological suppression and healthy CD4 count on TRIUMEQ,  Lab Results  Component Value Date   HIV1RNAQUANT <20 10/20/2014   Lab Results  Component Value Date   CD4TABS 740 10/20/2014   CD4TABS 630 02/28/2014   CD4TABS 530 08/10/2013    She continues to sufferfrom some diffuse myalgias of unclear as well as chronic malaise and lack of energy.  She asked Teresa Doyle to have labs drawn with her recent research visit including B12 folate vitamin D and thyroid-stimulating hormone and T4 and T3 levels. She was typically wanting to make sure that those were checked given the fact that she has had a goiter. All labs were the normal limits and reviewed with the patient again.  She would very much like to lose weight as she thinks that her obesity is committing to her muscle pains and lack of energy. She has tried [employer] without success and has tried various diet regimens though not yet a low carbohydrate regimen which is what I recommended to her   Review of Systems  Constitutional: Positive for fatigue. Negative for fever, chills, diaphoresis, activity change, appetite change and unexpected weight change.  HENT: Negative for congestion, rhinorrhea, sinus pressure, sneezing, sore throat and trouble swallowing.   Eyes: Negative for photophobia and visual disturbance.  Respiratory: Negative for cough, chest tightness, shortness of breath, wheezing and stridor.   Cardiovascular: Negative for chest pain, palpitations and leg swelling.  Gastrointestinal: Negative for nausea, vomiting, abdominal pain, diarrhea, constipation, blood in stool, abdominal distention and anal bleeding.  Genitourinary: Negative for dysuria, hematuria, flank pain and difficulty  urinating.  Musculoskeletal: Positive for myalgias. Negative for back pain, joint swelling, arthralgias and gait problem.  Skin: Negative for color change, pallor, rash and wound.  Neurological: Negative for dizziness, tremors, weakness and light-headedness.  Hematological: Negative for adenopathy. Does not bruise/bleed easily.  Psychiatric/Behavioral: Negative for behavioral problems, confusion, sleep disturbance, dysphoric mood, decreased concentration and agitation.       Objective:   Physical Exam  Constitutional: She is oriented to person, place, and time. She appears well-developed and well-nourished. No distress.  HENT:  Head: Normocephalic and atraumatic.  Mouth/Throat: Oropharynx is clear and moist. No oropharyngeal exudate.  Eyes: Conjunctivae and EOM are normal. Pupils are equal, round, and reactive to light. No scleral icterus.  Neck: Normal range of motion. Neck supple. No JVD present.  Cardiovascular: Normal rate, regular rhythm and normal heart sounds.  Exam reveals no gallop and no friction rub.   No murmur heard. Pulmonary/Chest: Effort normal and breath sounds normal. No respiratory distress. She has no wheezes. She has no rales. She exhibits no tenderness.  Abdominal: She exhibits no distension and no mass. There is no tenderness. There is no rebound and no guarding.  Musculoskeletal: She exhibits no edema or tenderness.  Lymphadenopathy:    She has no cervical adenopathy.  Neurological: She is alert and oriented to person, place, and time. She has normal reflexes. She exhibits normal muscle tone. Coordination normal.  Skin: Skin is warm and dry. She is not diaphoretic. No erythema. No pallor.  Psychiatric: She has a normal mood and affect. Her behavior is normal. Judgment and thought content normal.  Nursing note and vitals reviewed.  Assessment & Plan:  HIV: continue Triumeq and RTC in 6 months   Hypertension: continue  hctz well  controlled  Obesity:  Advised "Atkins" or "Paleo" low carb diet  Myalgias arthralgias: no clear cut cause identified and no worse since starting pitavastatin vs placebo from [facility]  Fatigue: I agree with the patient that weight loss might be helpful here.  I spent greater than 25 minutes with the patient including greater than 50% of time in face to face counsel of the patient re her fatigue, myalgias obesity HIV and hypertension and in coordination of their care.

## 2014-11-27 ENCOUNTER — Other Ambulatory Visit: Payer: Self-pay | Admitting: Infectious Disease

## 2014-11-27 DIAGNOSIS — B20 Human immunodeficiency virus [HIV] disease: Secondary | ICD-10-CM

## 2014-11-27 DIAGNOSIS — I10 Essential (primary) hypertension: Secondary | ICD-10-CM

## 2015-01-09 ENCOUNTER — Ambulatory Visit (INDEPENDENT_AMBULATORY_CARE_PROVIDER_SITE_OTHER): Payer: Self-pay | Admitting: Medical

## 2015-01-09 ENCOUNTER — Encounter: Payer: Self-pay | Admitting: Medical

## 2015-01-09 VITALS — BP 130/90 | HR 69 | Temp 98.4°F | Wt 248.0 lb

## 2015-01-09 DIAGNOSIS — J329 Chronic sinusitis, unspecified: Secondary | ICD-10-CM

## 2015-01-09 DIAGNOSIS — J40 Bronchitis, not specified as acute or chronic: Secondary | ICD-10-CM

## 2015-01-09 MED ORDER — HYDROCODONE-HOMATROPINE 5-1.5 MG/5ML PO SYRP: 5.0000 mL | ORAL_SOLUTION | Freq: Three times a day (TID) | ORAL | Status: DC | PRN

## 2015-01-09 MED ORDER — AZITHROMYCIN 250 MG PO TABS: ORAL_TABLET | ORAL | Status: DC

## 2015-01-09 MED ORDER — ALBUTEROL SULFATE HFA 108 (90 BASE) MCG/ACT IN AERS: 2.0000 | INHALATION_SPRAY | Freq: Four times a day (QID) | RESPIRATORY_TRACT | Status: DC | PRN

## 2015-01-09 NOTE — Progress Notes (Signed)
Subjective: Chief Complaint  Patient presents with  . flu?    body aches, said she was on 4 planes in two days and thinks that is what caused it. said that she feels like it is in her chest. started 2 weeks ago   Here for illness.  She has hx/o HIV disease, sinus problems in the past, and presents for flu like symptoms including body aches,  12/18/14 flew to [place], flew back 12/20/14, was on 4 separate flights.  Then drove to [place] from [place].  Lots of travel.   During that time felt run down, starting have body aches 12/25/14, and for last 2 weeks having body aches, congestion in chest, lots of cough, yellow phlegm, had fever early on 101.6 but this has resolved.   Has sinus pressure, couldn't breath out nose last few days, has occasional ear stopped up.   No sore throat, no nausea, vomiting, diarrhea.   No hemoptysis.   Overall body aches are a little improved compared last few days.   Using some Alka Seltzer cold and flu, Delsym.   No other aggravating or relieving factors. No other complaint.  On HIV research medication.  Compliant with all medications.    ROS as in subjective  Objective: BP 130/90 mmHg  Pulse 69  Temp(Src) 98.4 F (36.9 C) (Oral)  Wt 248 lb (112.492 kg)  LMP 03/03/2013  General appearance: Alert, WD/WN, no distress,mildly ill appearing                             Skin: warm, no rash, no diaphoresis                           Head: mild sinus tenderness                            Eyes: conjunctiva normal, corneas clear, PERRLA                            Ears: pearly TMs, external ear canals normal                          Nose: septum midline, turbinates swollen, with erythema and clear discharge             Mouth/throat: MMM, tongue normal, mild pharyngeal erythema                           Neck: supple, no adenopathy, no thyromegaly, non tender                           Lungs: +bronchial breath sounds, no rhonchi, no wheezes, no rales               Extremities: no  edema, non tender   Assessment: Encounter Diagnosis  Name Primary?  . Sinobronchitis Yes     Plan:   Discussed her symptoms, concerns, exam findings .  Will treat for sinobronchitis. Advised rest, good hydration, can use Mucinex DM OTC for cough/congestion, albuterol inhaler 3 times daily for cough/wheezing feeling, zpak antibiotic, hycodan syrup for worse cough.  I reviewed the 10/2014 HIV viral load and CBC.   F/u prn.

## 2015-01-22 ENCOUNTER — Other Ambulatory Visit: Payer: Self-pay | Admitting: Medical

## 2015-01-22 MED ORDER — AZITHROMYCIN 250 MG PO TABS: ORAL_TABLET | ORAL | Status: DC

## 2015-03-02 ENCOUNTER — Encounter (INDEPENDENT_AMBULATORY_CARE_PROVIDER_SITE_OTHER): Payer: Self-pay | Admitting: *Deleted

## 2015-03-02 VITALS — BP 134/86 | HR 67 | Temp 98.2°F | Resp 16 | Wt 247.6 lb

## 2015-03-02 DIAGNOSIS — Z006 Encounter for examination for normal comparison and control in clinical research program: Secondary | ICD-10-CM

## 2015-03-02 NOTE — Progress Notes (Signed)
Teresa Doyle is here for her month 16 visit for Reprieve, A Randomized Trial to Prevent Vascular Events in HIV (study drug is Pitavastatin 4mg  or placebo). She denies any current problems. She had a bout of sinubronchitis is nov-dec and had to get treated with antibiotics. It is completely resolved now. She denies any muscle aches or weakness and her pill counts for study drug are excellent, She will return in April for the next study visit.

## 2015-04-23 ENCOUNTER — Other Ambulatory Visit: Payer: Medicaid Other

## 2015-04-24 ENCOUNTER — Other Ambulatory Visit: Payer: Self-pay

## 2015-04-24 DIAGNOSIS — B20 Human immunodeficiency virus [HIV] disease: Secondary | ICD-10-CM

## 2015-04-24 LAB — CBC WITH DIFFERENTIAL/PLATELET
Basophils Absolute: 0.1 10*3/uL (ref 0.0–0.1)
Basophils Relative: 1 % (ref 0–1)
EOS ABS: 0.1 10*3/uL (ref 0.0–0.7)
EOS PCT: 1 % (ref 0–5)
HEMATOCRIT: 39.1 % (ref 36.0–46.0)
Hemoglobin: 12.6 g/dL (ref 12.0–15.0)
LYMPHS ABS: 2.7 10*3/uL (ref 0.7–4.0)
Lymphocytes Relative: 39 % (ref 12–46)
MCH: 23.9 pg — ABNORMAL LOW (ref 26.0–34.0)
MCHC: 32.2 g/dL (ref 30.0–36.0)
MCV: 74.1 fL — AB (ref 78.0–100.0)
MONOS PCT: 5 % (ref 3–12)
MPV: 11.5 fL (ref 8.6–12.4)
Monocytes Absolute: 0.3 10*3/uL (ref 0.1–1.0)
Neutro Abs: 3.7 10*3/uL (ref 1.7–7.7)
Neutrophils Relative %: 54 % (ref 43–77)
PLATELETS: 298 10*3/uL (ref 150–400)
RBC: 5.28 MIL/uL — ABNORMAL HIGH (ref 3.87–5.11)
RDW: 16.3 % — AB (ref 11.5–15.5)
WBC: 6.9 10*3/uL (ref 4.0–10.5)

## 2015-04-24 LAB — COMPLETE METABOLIC PANEL WITH GFR
ALT: 13 U/L (ref 6–29)
AST: 21 U/L (ref 10–35)
Albumin: 4.1 g/dL (ref 3.6–5.1)
Alkaline Phosphatase: 67 U/L (ref 33–130)
BILIRUBIN TOTAL: 0.3 mg/dL (ref 0.2–1.2)
BUN: 11 mg/dL (ref 7–25)
CHLORIDE: 101 mmol/L (ref 98–110)
CO2: 27 mmol/L (ref 20–31)
CREATININE: 0.71 mg/dL (ref 0.50–1.05)
Calcium: 9.5 mg/dL (ref 8.6–10.4)
GFR, Est African American: >89 mL/min (ref >=60)
GFR, Est Non African American: >89 mL/min (ref >=60)
GLUCOSE: 93 mg/dL (ref 65–99)
Potassium: 3.9 mmol/L (ref 3.5–5.3)
Sodium: 141 mmol/L (ref 135–146)
TOTAL PROTEIN: 6.9 g/dL (ref 6.1–8.1)

## 2015-04-24 LAB — LIPID PANEL
CHOL/HDL RATIO: 1.5 ratio (ref <=5.0)
CHOLESTEROL: 154 mg/dL (ref 125–200)
HDL: 100 mg/dL (ref >=46)
LDL Cholesterol: 39 mg/dL (ref <130)
TRIGLYCERIDES: 76 mg/dL (ref <150)
VLDL: 15 mg/dL (ref <30)

## 2015-04-25 LAB — T-HELPER CELL (CD4) - (RCID CLINIC ONLY)
CD4 T CELL HELPER: 32 % — AB (ref 33–55)
CD4 T Cell Abs: 850 /uL (ref 400–2700)

## 2015-04-26 LAB — HIV-1 RNA QUANT-NO REFLEX-BLD
HIV 1 RNA Quant: 104 copies/mL — ABNORMAL HIGH (ref <20)
HIV-1 RNA Quant, Log: 2.02 Log copies/mL — ABNORMAL HIGH (ref <1.30)

## 2015-05-07 ENCOUNTER — Encounter: Payer: Self-pay | Admitting: Infectious Disease

## 2015-05-07 ENCOUNTER — Ambulatory Visit (INDEPENDENT_AMBULATORY_CARE_PROVIDER_SITE_OTHER): Payer: Self-pay | Admitting: Infectious Disease

## 2015-05-07 VITALS — BP 115/79 | HR 71 | Temp 98.1°F | Wt 248.0 lb

## 2015-05-07 DIAGNOSIS — I10 Essential (primary) hypertension: Secondary | ICD-10-CM

## 2015-05-07 DIAGNOSIS — M791 Myalgia: Secondary | ICD-10-CM

## 2015-05-07 DIAGNOSIS — B20 Human immunodeficiency virus [HIV] disease: Secondary | ICD-10-CM

## 2015-05-07 DIAGNOSIS — M609 Myositis, unspecified: Secondary | ICD-10-CM

## 2015-05-07 DIAGNOSIS — IMO0001 Reserved for inherently not codable concepts without codable children: Secondary | ICD-10-CM

## 2015-05-07 DIAGNOSIS — E669 Obesity, unspecified: Secondary | ICD-10-CM

## 2015-05-07 NOTE — Progress Notes (Signed)
Chief complaint: fatigue, myalgias, weight gain Subjective:    Patient ID: Teresa Doyle, female    DOB: 1965/09/03, 50 y.o.   MRN: [id number]  HPI   49 year old [place] American lady with HIV/AIDS with prior PCP pneumonia currently with perfect virological suppression and healthy CD4 count on TRIUMEQ. She was disturbed that her VL is now up above 100 despite not missing any doses of her ARV at all.  Lab Results  Component Value Date   HIV1RNAQUANT 104* 04/24/2015   HIV1RNAQUANT <20 10/20/2014   HIV1RNAQUANT <20 02/28/2014      Lab Results  Component Value Date   CD4TABS 850 04/24/2015   CD4TABS 740 10/20/2014   CD4TABS 630 02/28/2014    She continues in REPRIEVE study and is in good spirits in general.   Past Medical History  Diagnosis Date  . Obesity   . Hypertension   . Fibroids uterine  . Suppurative hidradenitis     w/ prior multiple abscess, MRSA +  . Sinus problem     twice yearly   . Anemia     iron deficient, since teenage years  . Nearsightedness     wears glasses  . Genital HSV   . Urinary incontinence   . Normal labor and delivery     5/92 and 8/95  . HIV disease ([place]) 11/2011    sees Infect[facility] Disease Clinic  . H/O: hysterectomy 05/2013    due to fibroids and abnormal pap    Past Surgical History  Procedure Laterality Date  . Myomectomy  2005  . Bilateral tubal ligation    . Wisdom tooth extraction    . Flexible sigmoidoscopy  2005    due to abdominal pain; normal  . Laparoscopic hysterectomy  05/2013    due to fibroids and abnormal pap  . Bilateral salpingectomy  05/2013  . Ovarian cyst removal  05/2013    Family History  Problem Relation Age of Onset  . Diabetes Neg Hx   . Stroke Neg Hx   . Cancer Neg Hx   . Heart disease Neg Hx   . Hypertension Neg Hx   . Hyperlipidemia Mother       Social History   Social History  . Marital Status: Divorced    Spouse Name: N/A  . Number of Children: 2  . Years of Education: N/A    Occupational History  . 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 Smoking status: Never Smoker   . Smokeless tobacco: Never Used  . Alcohol Use: No     Comment: rare  . Drug Use: No  . Sexual Activity: Not Asked   Other Topics Concern  . None   Social History Narrative   Lives with her 2 children (1 daughter, 1 son), dog; single, exercise - walking, sometimes treadmill with fitness center at work.   Is a nurse, travel nursing, wellness coach    No Known Allergies   Current outpatient prescriptions:  .  hydrochlorothiazide (HYDRODIURIL) 25 MG tablet, TAKE 1 TABLET BY MOUTH EVERY DAY, Disp: 30 tablet, Rfl: 5 .  TRIUMEQ 600-50-300 MG TABS, TAKE 1 TABLET BY MOUTH DAILY, Disp: 30 tablet, Rfl: 5 .  albuterol (PROVENTIL HFA;VENTOLIN HFA) 108 (90 BASE) MCG/ACT inhaler, Inhale 2 puffs into the lungs every 6 (six) hours as needed for wheezing or shortness of breath. (Patient not taking: Reported on 05/07/2015), Disp: 1 Inhaler, Rfl: 0 .  azithromycin (ZITHROMAX)  250 MG tablet, 2 tablets day 1, then 1 tablet days 2-4 (Patient not taking: Reported on 05/07/2015), Disp: 6 tablet, Rfl: 0 .  fluconazole (DIFLUCAN) 100 MG tablet, Take 1 tablet (100 mg total) by mouth daily. (Patient not taking: Reported on 11/06/2014), Disp: 14 tablet, Rfl: 3 .  HYDROcodone-homatropine (HYCODAN) 5-1.5 MG/5ML syrup, Take 5 mLs by mouth every 8 (eight) hours as needed for cough. (Patient not taking: Reported on 05/07/2015), Disp: 120 mL, Rfl: 0 .  Pitavastatin Calcium 4 MG TABS, Take 1 tablet (4 mg total) by mouth daily. (Patient not taking: Reported on 05/07/2015), Disp: 30 tablet, Rfl:  .  tolterodine (DETROL LA) 4 MG 24 hr capsule, Take 1 capsule (4 mg total) by mouth daily. (Patient not taking: Reported on 01/09/2015), Disp: 30 capsule, Rfl: 3 .  valACYclovir (VALTREX) 1000 MG tablet, Take 1 tablet (1,000 mg total) by mouth 2 (two) times daily as needed. For outbreak (Patient not taking:  Reported on 01/09/2015), Disp: 30 tablet, Rfl: 3   Review of Systems  Constitutional: Positive for fatigue. Negative for fever, chills, diaphoresis, activity change, appetite change and unexpected weight change.  HENT: Negative for congestion, rhinorrhea, sinus pressure, sneezing, sore throat and trouble swallowing.   Eyes: Negative for photophobia and visual disturbance.  Respiratory: Negative for cough, chest tightness, shortness of breath, wheezing and stridor.   Cardiovascular: Negative for chest pain, palpitations and leg swelling.  Gastrointestinal: Negative for nausea, vomiting, abdominal pain, diarrhea, constipation, blood in stool, abdominal distention and anal bleeding.  Genitourinary: Negative for dysuria, hematuria, flank pain and difficulty urinating.  Musculoskeletal: Positive for myalgias. Negative for back pain, joint swelling, arthralgias and gait problem.  Skin: Negative for color change, pallor, rash and wound.  Neurological: Negative for dizziness, tremors, weakness and light-headedness.  Hematological: Negative for adenopathy. Does not bruise/bleed easily.  Psychiatric/Behavioral: Negative for behavioral problems, confusion, sleep disturbance, dysphoric mood, decreased concentration and agitation.       Objective:   Physical Exam  Constitutional: She is oriented to person, place, and time. She appears well-developed and well-nourished. No distress.  HENT:  Head: Normocephalic and atraumatic.  Mouth/Throat: Oropharynx is clear and moist. No oropharyngeal exudate.  Eyes: Conjunctivae and EOM are normal. Pupils are equal, round, and reactive to light. No scleral icterus.  Neck: Normal range of motion. Neck supple. No JVD present.  Cardiovascular: Normal rate, regular rhythm and normal heart sounds.  Exam reveals no gallop and no friction rub.   No murmur heard. Pulmonary/Chest: Effort normal and breath sounds normal. No respiratory distress. She has no wheezes. She has  no rales. She exhibits no tenderness.  Abdominal: She exhibits no distension and no mass. There is no tenderness. There is no rebound and no guarding.  Musculoskeletal: She exhibits no edema or tenderness.  Lymphadenopathy:    She has no cervical adenopathy.  Neurological: She is alert and oriented to person, place, and time. She has normal reflexes. She exhibits normal muscle tone. Coordination normal.  Skin: Skin is warm and dry. She is not diaphoretic. No erythema. No pallor.  Psychiatric: She has a normal mood and affect. Her behavior is normal. Judgment and thought content normal.  Nursing note and vitals reviewed.         Assessment & Plan:  HIV: continue Triumeq and recheck VL today. I wonder if the blood was left out at room temperature for too long leading to spurious replication or whether this is a viral blip beyond what she has typically  done.  Otherwise RTC in the summer for renewal of ADSAP   Hypertension: continue  hctz well controlled  Obesity:  Advised "Atkins" or "Paleo" low carb diet  Myalgias arthralgias: no clear cut cause identified and no worse since starting pitavastatin vs placebo from REPRIEVE   I spent greater than 25 minutes with the patient including greater than 50% of time in face to face counsel of the patient re her fatigue, myalgias obesity HIV and hypertension and in coordination of their care.

## 2015-05-08 LAB — HIV-1 RNA QUANT-NO REFLEX-BLD: HIV 1 RNA Quant: <20 copies/mL (ref <20)

## 2015-05-12 LAB — HM MAMMOGRAPHY: HM MAMMO: NORMAL (ref 0–4)

## 2015-05-17 ENCOUNTER — Telehealth: Payer: Self-pay | Admitting: Medical

## 2015-05-17 NOTE — Telephone Encounter (Signed)
Mammogram on 05/12/15 normal/no worrisome findings.

## 2015-05-17 NOTE — Telephone Encounter (Signed)
Pt is aware.  

## 2015-05-18 ENCOUNTER — Encounter: Payer: Self-pay | Admitting: Family Medicine

## 2015-05-28 ENCOUNTER — Other Ambulatory Visit: Payer: Self-pay | Admitting: Infectious Disease

## 2015-06-19 ENCOUNTER — Encounter: Payer: Self-pay | Admitting: Infectious Disease

## 2015-06-29 ENCOUNTER — Encounter (INDEPENDENT_AMBULATORY_CARE_PROVIDER_SITE_OTHER): Payer: Self-pay | Admitting: *Deleted

## 2015-06-29 VITALS — BP 122/84 | HR 61 | Temp 97.9°F | Wt 245.0 lb

## 2015-06-29 DIAGNOSIS — Z006 Encounter for examination for normal comparison and control in clinical research program: Secondary | ICD-10-CM

## 2015-06-29 NOTE — Progress Notes (Signed)
Teresa Doyle is here for her month 20 visit for A5332: A Randomized Trial to Prevent Vascular Events in HIV (The REPRIEVE Study). States excellent adherence with her medications. No new complaints verbalized. Denies any muscle aches or weakness. Her next study visit is scheduled for 9/11 @ 8:30am.

## 2015-10-02 ENCOUNTER — Emergency Department (HOSPITAL_COMMUNITY)
Admission: EM | Admit: 2015-10-02 | Discharge: 2015-10-02 | Disposition: A | Discharge status: Home / Self Care | Payer: Medicaid Other | Attending: Emergency Medicine | Admitting: Emergency Medicine

## 2015-10-02 ENCOUNTER — Encounter (HOSPITAL_COMMUNITY): Payer: Self-pay

## 2015-10-02 ENCOUNTER — Emergency Department (HOSPITAL_COMMUNITY): Payer: Medicaid Other

## 2015-10-02 DIAGNOSIS — Y990 Civilian activity done for income or pay: Secondary | ICD-10-CM | POA: Insufficient documentation

## 2015-10-02 DIAGNOSIS — Y929 Unspecified place or not applicable: Secondary | ICD-10-CM | POA: Insufficient documentation

## 2015-10-02 DIAGNOSIS — Y9389 Activity, other specified: Secondary | ICD-10-CM | POA: Insufficient documentation

## 2015-10-02 DIAGNOSIS — M25531 Pain in right wrist: Secondary | ICD-10-CM

## 2015-10-02 DIAGNOSIS — X500XXA Overexertion from strenuous movement or load, initial encounter: Secondary | ICD-10-CM | POA: Insufficient documentation

## 2015-10-02 DIAGNOSIS — I10 Essential (primary) hypertension: Secondary | ICD-10-CM | POA: Insufficient documentation

## 2015-10-02 MED ORDER — NAPROXEN 500 MG PO TABS: 500.0000 mg | ORAL_TABLET | Freq: Two times a day (BID) | ORAL | 0 refills | Status: DC

## 2015-10-02 NOTE — ED Triage Notes (Signed)
Onset this morning pt was pulling cart and when trying to life cart up on sidewalk over curb she pulled something in right wrist.  Painful when turning wrist side to side.

## 2015-10-02 NOTE — ED Notes (Signed)
Applied Velcro splint to right wrist; pt tolerated well

## 2015-10-02 NOTE — Discharge Instructions (Signed)
Take the prescribed medication as directed. Follow-up with Dr. Amedeo Plenty (hand specialist) if symptoms not improving in the next few days. Return to the ED for new or worsening symptoms.

## 2015-10-02 NOTE — ED Notes (Signed)
Declined W/C at D/C and was escorted to lobby by RN. 

## 2015-10-02 NOTE — ED Provider Notes (Signed)
[facility] DEPT Provider Note   CSN: [id number] Arrival date & time: 10/02/15  1442  By signing my name below, I, Shanna Cisco, attest that this documentation has been prepared under the direction and in the presence of Quincy Carnes, PA-C. Electronically signed by: Shanna Cisco, ED Scribe. 10/02/15. 4:43 PM.    History   Chief Complaint No chief complaint on file.   The history is provided by the patient. No language interpreter was used.   HPI Comments:  Teresa Doyle is a 50 y.o. female who presents to the Emergency Department complaining of right wrist pain, which started earlier this morning. Pt reports that she was pulling her cart at work this morning and believes she pulled something in her wrist. Pain localized to ulnar aspect of wrist. No associated symptoms. She has been using a wrist brace to provide some relief. Denies decreased strength or sensations in affected hand.   Past Medical History:  Diagnosis Date  . Anemia    iron deficient, since teenage years  . Fibroids uterine  . Genital HSV   . H/O: hysterectomy 05/2013   due to fibroids and abnormal pap  . HIV disease ([place]) 11/2011   sees Infectious Disease Clinic  . Hypertension   . Nearsightedness    wears glasses  . Normal labor and delivery    5/92 and 8/95  . Obesity   . Sinus problem    twice yearly   . Suppurative hidradenitis    w/ prior multiple abscess, MRSA +  . Urinary incontinence     Patient Active Problem List   Diagnosis Date Noted  . Genital HSV 08/03/2014  . Other abnormal cytological finding of specimen from cervix 08/03/2014  . Absence of bladder continence 08/03/2014  . Myalgia and myositis 03/29/2014  . AIDS ([place]) 11/20/2011  . Anxiety state 11/20/2011  . 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 Obesity 06/10/2011  . Essential hypertension, benign 06/10/2011  . Goiter 06/10/2011  . Suppurative hidradenitis 01/31/2011    Past Surgical History:  Procedure  Laterality Date  . ABDOMINAL HYSTERECTOMY    . BILATERAL SALPINGECTOMY  05/2013  . bilateral tubal ligation    . FLEXIBLE SIGMOIDOSCOPY  2005   due to abdominal pain; normal  . LAPAROSCOPIC HYSTERECTOMY  05/2013   due to fibroids and abnormal pap  . myomectomy  2005  . OVARIAN CYST REMOVAL  05/2013  . WISDOM TOOTH EXTRACTION      OB History    No data available       Home Medications    Prior to Admission medications   Medication Sig Start Date End Date Taking? Authorizing Provider  albuterol (PROVENTIL HFA;VENTOLIN HFA) 108 (90 BASE) MCG/ACT inhaler Inhale 2 puffs into the lungs every 6 (six) hours as needed for wheezing or shortness of breath. Patient not taking: Reported on 05/07/2015 01/09/15   Camelia Eng Tysinger, PA-C  hydrochlorothiazide (HYDRODIURIL) 25 MG tablet TAKE 1 TABLET BY MOUTH EVERY DAY 05/28/15   Campbell Riches, MD  HYDROcodone-homatropine [facility]) 5-1.5 MG/5ML syrup Take 5 mLs by mouth every 8 (eight) hours as needed for cough. Patient not taking: Reported on 05/07/2015 01/09/15   Camelia Eng Tysinger, PA-C  Pitavastatin Calcium 4 MG TABS Take 1 tablet (4 mg total) by mouth daily. Patient not taking: Reported on 05/07/2015 10/28/13   Truman Hayward, MD  tolterodine (DETROL LA) 4 MG 24 hr capsule Take 1 capsule (4 mg total) by mouth daily. Patient not taking:  Reported on 01/09/2015 08/03/14   Camelia Eng Tysinger, PA-C  TRIUMEQ 600-50-300 MG tablet TAKE 1 TABLET BY MOUTH DAILY 05/28/15   Campbell Riches, MD  valACYclovir (VALTREX) 1000 MG tablet Take 1 tablet (1,000 mg total) by mouth 2 (two) times daily as needed. For outbreak Patient not taking: Reported on 01/09/2015 01/20/13   Truman Hayward, MD    Family History Family History  Problem Relation Age of Onset  . Hyperlipidemia Mother   . Diabetes Neg Hx   . Stroke Neg Hx   . Cancer Neg Hx   . Heart disease Neg Hx   . Hypertension Neg Hx     Social History Social History  Substance Use Topics  .  Smoking status: Never Smoker  . Smokeless tobacco: Never Used  . Alcohol use Yes     Comment: rare     Allergies   Review of patient's allergies indicates no known allergies.   Review of Systems Review of Systems  Musculoskeletal: Positive for arthralgias and myalgias.       Right wrist pain.  All other systems reviewed and are negative.    Physical Exam Updated Vital Signs BP 144/85 (BP Location: Left Arm)   Pulse 65   Temp 98.5 F (36.9 C) (Oral)   Resp 16   Ht 5\' 2"  (1.575 m)   Wt 243 lb (110.2 kg)   LMP 03/03/2013   SpO2 100%   BMI 44.45 kg/m   Physical Exam  Constitutional: She is oriented to person, place, and time. She appears well-developed and well-nourished.  HENT:  Head: Normocephalic and atraumatic.  Mouth/Throat: Oropharynx is clear and moist.  Eyes: Conjunctivae and EOM are normal. Pupils are equal, round, and reactive to light.  Neck: Normal range of motion.  Cardiovascular: Normal rate, regular rhythm and normal heart sounds.   Pulmonary/Chest: Effort normal and breath sounds normal.  Abdominal: Soft. Bowel sounds are normal.  Musculoskeletal: Normal range of motion.  Right wrist overall normal in appearance without swellling or bony deformities; some pain noted along ulnar aspect; pain with eversion/inversion of wrist; no pain with flexion/extension; moving all fingers normally; normal grip strength; normal sensation throughout  Neurological: She is alert and oriented to person, place, and time.  Skin: Skin is warm and dry.  Psychiatric: She has a normal mood and affect.  Nursing note and vitals reviewed.    ED Treatments / Results  DIAGNOSTIC STUDIES:  Oxygen Saturation is 100% on room air, normal by my interpretation.    COORDINATION OF CARE:  4:39 PM Discussed treatment plan with pt at bedside, which includes wearing a wrist brace for the next couple of days and anti-inflammatory prn, and pt agreed to plan.  Labs (all labs ordered are  listed, but only abnormal results are displayed) Labs Reviewed - No data to display  EKG  EKG Interpretation None       Radiology Dg Wrist Complete Right  Result Date: 10/02/2015 CLINICAL DATA:  Right wrist pain, injury this morning. EXAM: RIGHT WRIST - COMPLETE 3+ VIEW COMPARISON:  None. FINDINGS: There is no evidence of fracture or dislocation. There is no evidence of arthropathy or other focal bone abnormality. Soft tissues are unremarkable. IMPRESSION: Negative. Electronically Signed   By: Rolm Baptise M.D.   On: 10/02/2015 16:08    Procedures Procedures (including critical care time)  Medications Ordered in ED Medications - No data to display   Initial Impression / Assessment and Plan / ED Course  I have reviewed the triage vital signs and the nursing notes.  Pertinent labs & imaging results that were available during my care of the patient were reviewed by me and considered in my medical decision making (see chart for details).  Clinical Course   50 year old female here with right wrist pain after pulling on a cart this morning. Her wrist is normal in appearance without any noted swelling or bony deformities. She has full range of motion of all of her fingers. She does have some pain with inversion and eversion of her wrist. She has normal strength and sensation throughout right hand. X-rays negative for acute bony findings. Suspect sprain type injury. Wrist block applied for comfort. Will start on anti-inflammatories. Given hand surgery follow-up if not improving over the next few days.  May also follow-up with her PCP.  Discussed plan with patient, he/she acknowledged understanding and agreed with plan of care.  Return precautions given for new or worsening symptoms.  Final Clinical Impressions(s) / ED Diagnoses   Final diagnoses:  Wrist pain, acute, right    New Prescriptions New Prescriptions   NAPROXEN (NAPROSYN) 500 MG TABLET    Take 1 tablet (500 mg total) by  mouth 2 (two) times daily with a meal.   I personally performed the services described in this documentation, which was scribed in my presence. The recorded information has been reviewed and is accurate.    Larene Pickett, PA-C 10/02/15 [place], MD 10/03/15 [phone]

## 2015-10-05 ENCOUNTER — Other Ambulatory Visit (INDEPENDENT_AMBULATORY_CARE_PROVIDER_SITE_OTHER): Payer: Self-pay

## 2015-10-05 DIAGNOSIS — B20 Human immunodeficiency virus [HIV] disease: Secondary | ICD-10-CM

## 2015-10-05 DIAGNOSIS — Z113 Encounter for screening for infections with a predominantly sexual mode of transmission: Secondary | ICD-10-CM

## 2015-10-05 LAB — COMPLETE METABOLIC PANEL WITH GFR
ALK PHOS: 59 U/L (ref 33–130)
ALT: 13 U/L (ref 6–29)
AST: 19 U/L (ref 10–35)
Albumin: 3.8 g/dL (ref 3.6–5.1)
BILIRUBIN TOTAL: 0.6 mg/dL (ref 0.2–1.2)
BUN: 13 mg/dL (ref 7–25)
CALCIUM: 9.4 mg/dL (ref 8.6–10.4)
CHLORIDE: 103 mmol/L (ref 98–110)
CO2: 28 mmol/L (ref 20–31)
CREATININE: 0.79 mg/dL (ref 0.50–1.05)
GFR, Est Non African American: 88 mL/min (ref >=60)
Glucose, Bld: 93 mg/dL (ref 65–99)
Potassium: 3.7 mmol/L (ref 3.5–5.3)
Sodium: 141 mmol/L (ref 135–146)
Total Protein: 6.2 g/dL (ref 6.1–8.1)

## 2015-10-05 LAB — CBC WITH DIFFERENTIAL/PLATELET
BASOS PCT: 0 %
Basophils Absolute: 0 cells/uL (ref 0–200)
EOS ABS: 142 {cells}/uL (ref 15–500)
Eosinophils Relative: 2 %
HEMATOCRIT: 38 % (ref 35.0–45.0)
Hemoglobin: 12 g/dL (ref 11.7–15.5)
LYMPHS PCT: 34 %
Lymphs Abs: 2414 cells/uL (ref 850–3900)
MCH: 23.8 pg — ABNORMAL LOW (ref 27.0–33.0)
MCHC: 31.6 g/dL — ABNORMAL LOW (ref 32.0–36.0)
MCV: 75.2 fL — AB (ref 80.0–100.0)
MONO ABS: 355 {cells}/uL (ref 200–950)
MPV: 10.4 fL (ref 7.5–12.5)
Monocytes Relative: 5 %
NEUTROS PCT: 59 %
Neutro Abs: 4189 cells/uL (ref 1500–7800)
Platelets: 228 10*3/uL (ref 140–400)
RBC: 5.05 MIL/uL (ref 3.80–5.10)
RDW: 16.7 % — AB (ref 11.0–15.0)
WBC: 7.1 10*3/uL (ref 3.8–10.8)

## 2015-10-05 LAB — T-HELPER CELL (CD4) - (RCID CLINIC ONLY)
CD4 % Helper T Cell: 31 % — ABNORMAL LOW (ref 33–55)
CD4 T Cell Abs: 750 /uL (ref 400–2700)

## 2015-10-06 LAB — RPR

## 2015-10-08 ENCOUNTER — Encounter: Payer: Self-pay | Admitting: Infectious Disease

## 2015-10-09 LAB — HIV-1 RNA QUANT-NO REFLEX-BLD

## 2015-10-22 ENCOUNTER — Telehealth: Payer: Self-pay | Admitting: Medical

## 2015-10-22 ENCOUNTER — Encounter: Payer: Self-pay | Admitting: Infectious Disease

## 2015-10-22 ENCOUNTER — Ambulatory Visit (INDEPENDENT_AMBULATORY_CARE_PROVIDER_SITE_OTHER): Payer: Self-pay | Admitting: Infectious Disease

## 2015-10-22 ENCOUNTER — Encounter (INDEPENDENT_AMBULATORY_CARE_PROVIDER_SITE_OTHER): Payer: Self-pay | Admitting: *Deleted

## 2015-10-22 VITALS — BP 127/85 | HR 53 | Temp 98.2°F | Ht 62.5 in | Wt 240.0 lb

## 2015-10-22 VITALS — BP 134/83 | HR 57 | Temp 97.9°F | Resp 16 | Wt 240.8 lb

## 2015-10-22 DIAGNOSIS — I10 Essential (primary) hypertension: Secondary | ICD-10-CM

## 2015-10-22 DIAGNOSIS — Z23 Encounter for immunization: Secondary | ICD-10-CM

## 2015-10-22 DIAGNOSIS — B20 Human immunodeficiency virus [HIV] disease: Secondary | ICD-10-CM

## 2015-10-22 DIAGNOSIS — E669 Obesity, unspecified: Secondary | ICD-10-CM

## 2015-10-22 DIAGNOSIS — Z006 Encounter for examination for normal comparison and control in clinical research program: Secondary | ICD-10-CM

## 2015-10-22 NOTE — Progress Notes (Signed)
Chief complaint: Follow-up for HIV on medications Subjective:    Patient ID: Teresa Doyle, female    DOB: Jun 15, 1965, 50 y.o.   MRN: [id number]  HPI  50 year old [place] American lady with HIV/AIDS with prior PCP pneumonia currently with perfect virological suppression and healthy CD4 count on TRIUMEQ. She is contemplating moving to [place] where she could potentially obtain a more stable form of employment. I've counseled her to seek out [employer] funded clinics in that area.    Lab Results  Component Value Date   HIV1RNAQUANT <20 10/05/2015   HIV1RNAQUANT <20 05/07/2015   HIV1RNAQUANT 104 (H) 04/24/2015      Lab Results  Component Value Date   CD4TABS 750 10/05/2015   CD4TABS 850 04/24/2015   CD4TABS 740 10/20/2014    She continues in REPRIEVE study and is in good spirits in general.   Past Medical History:  Diagnosis Date  . Anemia    iron deficient, since teenage years  . Fibroids uterine  . Genital HSV   . H/O: hysterectomy 05/2013   due to fibroids and abnormal pap  . HIV disease ([place]) 11/2011   sees Infectious Disease Clinic  . Hypertension   . Nearsightedness    wears glasses  . Normal labor and delivery    5/92 and 8/95  . Obesity   . Sinus problem    twice yearly   . Suppurative hidradenitis    w/ prior multiple abscess, MRSA +  . Urinary incontinence     Past Surgical History:  Procedure Laterality Date  . ABDOMINAL HYSTERECTOMY    . BILATERAL SALPINGECTOMY  05/2013  . bilateral tubal ligation    . FLEXIBLE SIGMOIDOSCOPY  2005   due to abdominal pain; normal  . LAPAROSCOPIC HYSTERECTOMY  05/2013   due to fibroids and abnormal pap  . myomectomy  2005  . OVARIAN CYST REMOVAL  05/2013  . WISDOM TOOTH EXTRACTION      Family History  Problem Relation Age of Onset  . Hyperlipidemia Mother   . Diabetes Neg Hx   . Stroke Neg Hx   . Cancer Neg Hx   . Heart disease Neg Hx   . Hypertension Neg Hx       Social History   Social History  .  Marital status: Divorced    Spouse name: N/A  . Number of children: 2  . Years of education: N/A   Occupational History  . 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 Smoking status: Never Smoker  . Smokeless tobacco: Never Used  . Alcohol use Yes     Comment: rare  . Drug use: No  . Sexual activity: Not on file   Other Topics Concern  . Not on file   Social History Narrative   Lives with her 2 children (1 daughter, 1 son), dog; single, exercise - walking, sometimes treadmill with fitness center at work.   Is a nurse, travel nursing, wellness coach    No Known Allergies   Current Outpatient Prescriptions:  .  albuterol (PROVENTIL HFA;VENTOLIN HFA) 108 (90 BASE) MCG/ACT inhaler, Inhale 2 puffs into the lungs every 6 (six) hours as needed for wheezing or shortness of breath. (Patient not taking: Reported on 05/07/2015), Disp: 1 Inhaler, Rfl: 0 .  hydrochlorothiazide (HYDRODIURIL) 25 MG tablet, TAKE 1 TABLET BY MOUTH EVERY DAY, Disp: 30 tablet, Rfl: 4 .  HYDROcodone-homatropine (HYCODAN) 5-1.5 MG/5ML syrup, Take 5 mLs  by mouth every 8 (eight) hours as needed for cough. (Patient not taking: Reported on 05/07/2015), Disp: 120 mL, Rfl: 0 .  naproxen (NAPROSYN) 500 MG tablet, Take 1 tablet (500 mg total) by mouth 2 (two) times daily with a meal., Disp: 30 tablet, Rfl: 0 .  Pitavastatin Calcium 4 MG TABS, Take 1 tablet (4 mg total) by mouth daily. (Patient not taking: Reported on 05/07/2015), Disp: 30 tablet, Rfl:  .  tolterodine (DETROL LA) 4 MG 24 hr capsule, Take 1 capsule (4 mg total) by mouth daily. (Patient not taking: Reported on 01/09/2015), Disp: 30 capsule, Rfl: 3 .  TRIUMEQ 600-50-300 MG tablet, TAKE 1 TABLET BY MOUTH DAILY, Disp: 30 tablet, Rfl: 4 .  valACYclovir (VALTREX) 1000 MG tablet, Take 1 tablet (1,000 mg total) by mouth 2 (two) times daily as needed. For outbreak (Patient not taking: Reported on 01/09/2015), Disp: 30 tablet, Rfl:  3   Review of Systems  Constitutional: Positive for fatigue. Negative for activity change, appetite change, chills, diaphoresis, fever and unexpected weight change.  HENT: Negative for congestion, rhinorrhea, sinus pressure, sneezing, sore throat and trouble swallowing.   Eyes: Negative for photophobia and visual disturbance.  Respiratory: Negative for cough, chest tightness, shortness of breath, wheezing and stridor.   Cardiovascular: Negative for chest pain, palpitations and leg swelling.  Gastrointestinal: Negative for abdominal distention, abdominal pain, anal bleeding, blood in stool, constipation, diarrhea, nausea and vomiting.  Genitourinary: Negative for difficulty urinating, dysuria, flank pain and hematuria.  Musculoskeletal: Positive for myalgias. Negative for arthralgias, back pain, gait problem and joint swelling.  Skin: Negative for color change, pallor, rash and wound.  Neurological: Negative for dizziness, tremors, weakness and light-headedness.  Hematological: Negative for adenopathy. Does not bruise/bleed easily.  Psychiatric/Behavioral: Negative for agitation, behavioral problems, confusion, decreased concentration, dysphoric mood and sleep disturbance.       Objective:   Physical Exam  Constitutional: She is oriented to person, place, and time. She appears well-developed and well-nourished. No distress.  HENT:  Head: Normocephalic and atraumatic.  Mouth/Throat: Oropharynx is clear and moist. No oropharyngeal exudate.  Eyes: Conjunctivae and EOM are normal. Pupils are equal, round, and reactive to light. No scleral icterus.  Neck: Normal range of motion. Neck supple. No JVD present.  Cardiovascular: Normal rate, regular rhythm and normal heart sounds.  Exam reveals no gallop and no friction rub.   No murmur heard. Pulmonary/Chest: Effort normal and breath sounds normal. No respiratory distress. She has no wheezes. She has no rales. She exhibits no tenderness.   Abdominal: She exhibits no distension and no mass. There is no tenderness. There is no rebound and no guarding.  Musculoskeletal: She exhibits no edema or tenderness.  Lymphadenopathy:    She has no cervical adenopathy.  Neurological: She is alert and oriented to person, place, and time. She has normal reflexes. She exhibits normal muscle tone. Coordination normal.  Skin: Skin is warm and dry. She is not diaphoretic. No erythema. No pallor.  Psychiatric: She has a normal mood and affect. Her behavior is normal. Judgment and thought content normal.  Nursing note and vitals reviewed.         Assessment & Plan:  HIV: continue Triumeq and recheck labs in December if she is still in the area and then renew ADAP in January if she is in the area   Hypertension: continue current meds Vitals:   10/22/15 0939  BP: 127/85  Pulse: (!) 53  Temp: 98.2 F (36.8 C)  Obesity:  Diet and exercise   I spent greater than 25 minutes with the patient including greater than 50% of time in face to face counsel of the patient re her  obesity HIV and hypertension and in coordination of their care.

## 2015-10-22 NOTE — Progress Notes (Signed)
Teresa Doyle is here for here month 24 study visit for Reprieve, A Randomized Trial to Prevent Vascular Events in HIV (study drug is Pitavastatin 4mg  or placebo). She says her adherence is excellent, doesn't miss pills. Denies any new muscle aches or weakness, but did hurt her rt wrist in August and went to the ED, no FX, was given naproxen for pain. She may be traveling to [place] for work in December by will continue to come here for study visits.

## 2015-10-22 NOTE — Addendum Note (Signed)
Addended by: Landis Gandy on: 10/22/2015 03:56 PM   Modules accepted: Orders

## 2015-10-22 NOTE — Telephone Encounter (Signed)
Due for physical visit

## 2015-10-30 NOTE — Telephone Encounter (Signed)
Pt was called and informed that she needs a CPE. Pt states that she only has family planning medicaid and she cant afford to come in. Pt was advised to call back and schedule an appt as soon as she can.

## 2015-11-14 ENCOUNTER — Other Ambulatory Visit: Payer: Self-pay | Admitting: Infectious Diseases

## 2015-11-14 DIAGNOSIS — I10 Essential (primary) hypertension: Secondary | ICD-10-CM

## 2015-11-14 DIAGNOSIS — B20 Human immunodeficiency virus [HIV] disease: Secondary | ICD-10-CM

## 2016-01-25 ENCOUNTER — Other Ambulatory Visit: Payer: Medicaid Other

## 2016-02-13 ENCOUNTER — Ambulatory Visit: Payer: Medicaid Other | Admitting: Infectious Disease
